# Patient Record
Sex: Female | Born: 1970 | Hispanic: No | Marital: Single | Smoking: Former smoker
Health system: Southern US, Community
[De-identification: ages and names within clinical notes are randomized; demographics above are authoritative.]

## PROBLEM LIST (undated history)

## (undated) ENCOUNTER — Ambulatory Visit (HOSPITAL_COMMUNITY)

## (undated) ENCOUNTER — Emergency Department (HOSPITAL_COMMUNITY): Admission: EM | Payer: Self-pay | Source: Home / Self Care

## (undated) DIAGNOSIS — Z8781 Personal history of (healed) traumatic fracture: Secondary | ICD-10-CM

## (undated) DIAGNOSIS — G8929 Other chronic pain: Secondary | ICD-10-CM

## (undated) DIAGNOSIS — J45909 Unspecified asthma, uncomplicated: Secondary | ICD-10-CM

## (undated) DIAGNOSIS — E611 Iron deficiency: Secondary | ICD-10-CM

## (undated) DIAGNOSIS — F419 Anxiety disorder, unspecified: Secondary | ICD-10-CM

## (undated) DIAGNOSIS — F32A Depression, unspecified: Secondary | ICD-10-CM

## (undated) DIAGNOSIS — I82409 Acute embolism and thrombosis of unspecified deep veins of unspecified lower extremity: Secondary | ICD-10-CM

## (undated) DIAGNOSIS — I10 Essential (primary) hypertension: Secondary | ICD-10-CM

## (undated) HISTORY — DX: Other chronic pain: G89.29

## (undated) HISTORY — DX: Iron deficiency: E61.1

## (undated) HISTORY — PX: FRACTURE SURGERY: SHX138

## (undated) HISTORY — DX: Personal history of (healed) traumatic fracture: Z87.81

## (undated) HISTORY — DX: Depression, unspecified: F32.A

## (undated) HISTORY — PX: ABDOMINAL HYSTERECTOMY: SHX81

## (undated) HISTORY — DX: Anxiety disorder, unspecified: F41.9

---

## 2015-05-09 DIAGNOSIS — I82409 Acute embolism and thrombosis of unspecified deep veins of unspecified lower extremity: Secondary | ICD-10-CM

## 2015-05-09 HISTORY — DX: Acute embolism and thrombosis of unspecified deep veins of unspecified lower extremity: I82.409

## 2018-05-21 ENCOUNTER — Encounter (HOSPITAL_COMMUNITY): Payer: Self-pay

## 2018-05-21 ENCOUNTER — Emergency Department (HOSPITAL_COMMUNITY)
Admission: EM | Admit: 2018-05-21 | Discharge: 2018-05-21 | Disposition: A | Discharge status: Home / Self Care | Payer: Self-pay | Attending: Emergency Medicine | Admitting: Emergency Medicine

## 2018-05-21 DIAGNOSIS — M79605 Pain in left leg: Secondary | ICD-10-CM | POA: Insufficient documentation

## 2018-05-21 DIAGNOSIS — J45909 Unspecified asthma, uncomplicated: Secondary | ICD-10-CM | POA: Insufficient documentation

## 2018-05-21 DIAGNOSIS — I10 Essential (primary) hypertension: Secondary | ICD-10-CM | POA: Insufficient documentation

## 2018-05-21 DIAGNOSIS — F17210 Nicotine dependence, cigarettes, uncomplicated: Secondary | ICD-10-CM | POA: Insufficient documentation

## 2018-05-21 HISTORY — DX: Acute embolism and thrombosis of unspecified deep veins of unspecified lower extremity: I82.409

## 2018-05-21 HISTORY — DX: Unspecified asthma, uncomplicated: J45.909

## 2018-05-21 HISTORY — DX: Essential (primary) hypertension: I10

## 2018-05-21 LAB — I-STAT CHEM 8, ED
BUN: 9 mg/dL (ref 6–20)
CREATININE: 0.7 mg/dL (ref 0.44–1.00)
Calcium, Ion: 1.2 mmol/L (ref 1.15–1.40)
Chloride: 106 mmol/L (ref 98–111)
Glucose, Bld: 80 mg/dL (ref 70–99)
HCT: 40 % (ref 36.0–46.0)
Hemoglobin: 13.6 g/dL (ref 12.0–15.0)
Potassium: 3.3 mmol/L — ABNORMAL LOW (ref 3.5–5.1)
Sodium: 141 mmol/L (ref 135–145)
TCO2: 25 mmol/L (ref 22–32)

## 2018-05-21 MED ORDER — KETOROLAC TROMETHAMINE 30 MG/ML IJ SOLN: 30.0000 mg | Freq: Once | INTRAMUSCULAR | Status: DC

## 2018-05-21 MED ORDER — RIVAROXABAN (XARELTO) VTE STARTER PACK (15 & 20 MG): ORAL_TABLET | ORAL | 0 refills | Status: DC

## 2018-05-21 MED ORDER — RIVAROXABAN 15 MG PO TABS
15.0000 mg | ORAL_TABLET | Freq: Once | ORAL | Status: AC
  Administered 2018-05-21: 15 mg via ORAL
  Filled 2018-05-21: qty 1

## 2018-05-21 NOTE — Discharge Instructions (Addendum)
I have written a new prescription for your Xarelto medication please take this as discussed.  Please go to the color department for your ultrasound to rule out any clots in your legs.  Experience any shortness of breath, chest pain, worsening symptoms please return to the ED.

## 2018-05-21 NOTE — ED Triage Notes (Addendum)
Patient arrived POV  Patient c/o left leg swelling and pain in back of the left calf Patient states, "muscles are stiffening up"  Patient came on a greyhound christmas day from [place] and after she arrived she noticed the swelling in the left calf on Christmas Day.   -Intermittent swelling since christmas  Patient is on a blood thinner   Hx. DVT 2011, 2017  A/Ox4 Ambulatory in triage

## 2018-05-21 NOTE — ED Provider Notes (Addendum)
[facility] DEPT Provider Note   CSN: [id number] Arrival date & time: 05/21/18  1806     History   Chief Complaint Chief Complaint  Patient presents with  . Leg Pain    HPI Lynn Pennington is a 48 y.o. female.  48 y.o female with a PMH of HTN, DVT presents to the ED with a complaint of left lower leg swelling since 12/15.  Patient has a previous history of DVT and is currently on a blood thinner but reports she might have missed some doses along the way.  Reports she took a Greyhound bus from [name] to [place] over Christmas and has had worsening swelling to her left lower leg.  Ports there is pain along her calf region extending into her knee and leg.  Patient currently worse compression socks and stands on her feet multiple hours a day but reports the swelling has been uncontrolled with compression socks at this time.  Ports the pain is worse with standing and ambulating.  Denies any shortness of breath, chest pain, fevers or other complaints.     Past Medical History:  Diagnosis Date  . Asthma   . DVT (deep venous thrombosis) ([place]) 2017  . Hypertension     There are no active problems to display for this patient.   Past Surgical History:  Procedure Laterality Date  . ABDOMINAL HYSTERECTOMY    . FRACTURE SURGERY       OB History   No obstetric history on file.      Home Medications    Prior to Admission medications   Medication Sig Start Date End Date Taking? Authorizing Provider  Rivaroxaban 15 & 20 MG TBPK Take as directed on package: Start with one 15mg  tablet by mouth twice a day with food. On Day 22, switch to one 20mg  tablet once a day with food. 05/21/18   Janeece Fitting, PA-C    Family History History reviewed. No pertinent family history.  Social History Social History   Tobacco Use  . Smoking status: Current Every Day Smoker    Packs/day: 0.50    Types: Cigarettes  . Smokeless tobacco: Never Used    Substance Use Topics  . Alcohol use: Not Currently  . Drug use: Not Currently     Allergies   Patient has no allergy information on record.   Review of Systems Review of Systems  Constitutional: Negative for fever.  Respiratory: Negative for shortness of breath.   Cardiovascular: Positive for leg swelling. Negative for chest pain.     Physical Exam Updated Vital Signs BP (!) 171/100   Pulse 62   Temp 98.7 F (37.1 C)   Resp 18   SpO2 100%   Physical Exam Vitals signs and nursing note reviewed.  Constitutional:      General: She is not in acute distress.    Appearance: She is well-developed.  HENT:     Head: Normocephalic and atraumatic.     Mouth/Throat:     Pharynx: No oropharyngeal exudate.  Eyes:     Pupils: Pupils are equal, round, and reactive to light.  Neck:     Musculoskeletal: Normal range of motion.  Cardiovascular:     Rate and Rhythm: Regular rhythm.     Heart sounds: Normal heart sounds.  Pulmonary:     Effort: Pulmonary effort is normal. No respiratory distress.     Breath sounds: Normal breath sounds.  Abdominal:     General: Bowel sounds are normal.  There is no distension.     Palpations: Abdomen is soft.     Tenderness: There is no abdominal tenderness.  Musculoskeletal:        General: No deformity.     Right lower leg: No edema.     Left lower leg: She exhibits tenderness and swelling. No edema.       Legs:     Comments: Swelling to the left lower leg, calf tenderness with palpation.  Strength is 5/5 dorsiflexion and plantarflexion.  Skin:    General: Skin is warm and dry.  Neurological:     Mental Status: She is alert and oriented to person, place, and time.      ED Treatments / Results  Labs (all labs ordered are listed, but only abnormal results are displayed) Labs Reviewed  I-STAT CHEM 8, ED - Abnormal; Notable for the following components:      Result Value   Potassium 3.3 (*)    All other components within normal  limits    EKG None  Radiology No results found.  Procedures Procedures (including critical care time)  Medications Ordered in ED Medications  Rivaroxaban (XARELTO) tablet 15 mg (has no administration in time range)     Initial Impression / Assessment and Plan / ED Course  I have reviewed the triage vital signs and the nursing notes.  Pertinent labs & imaging results that were available during my care of the patient were reviewed by me and considered in my medical decision making (see chart for details).     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  Patient took a hound bus ride from [name] to [place].  Reports the swelling in her left leg has worsened over time.  She reports missing a few doses of her Xarelto and unsure how many times she is missed but approximately 5 doses due to traveling and conflict in schedule.  During evaluation left leg is significantly more swollen.  She reports pain along the knee region as well.  Patient is currently working standing on her feet multiple hours of the day and states this worsens the pain.  Almyra Free we do not have ultrasound at this time and able to evaluate for a DVT.  At this time will restart patient on Xarelto with a 50 mg dose twice daily for the next 21 days and then switch back to 20 mg on day 22.  Patient reporting pain along her knee will provide her with a shot of steroid as her creatinine level is within normal limits.  Denies any chest pain, shortness of breath, no tachycardia or hypoxia during evaluation.  Show a creatinine level of 0.7 will start her dosing of Xarelto at this time providing her 50 mg today.  Urgently patient is requesting NSAID therapy at this time, I will not be providing this as she is currently going back on her Xarelto medication.  Cryotherapy will be referred at this time.  Patient's vitals stable during  ED visit, patient stable for discharge at this time.   Final Clinical Impressions(s) / ED Diagnoses   Final diagnoses:  Left leg pain    ED Discharge Orders         Ordered    LE VENOUS  Status:  Canceled     05/21/18 1959    LE VENOUS     05/21/18 2004    US  Venous Img Lower Unilateral Left     05/21/18 2007    Rivaroxaban 15 & 20 MG TBPK  Status:  Discontinued     05/21/18 2007    Rivaroxaban 15 & 20 MG TBPK     05/21/18 2007           Janeece Fitting, PA-C 05/21/18 2102    Janeece Fitting, PA-C 05/21/18 2107    Davonna Belling, MD 05/21/18 [phone]

## 2018-05-22 ENCOUNTER — Ambulatory Visit (HOSPITAL_COMMUNITY)
Admission: RE | Admit: 2018-05-22 | Discharge: 2018-05-22 | Disposition: A | Discharge status: Home / Self Care | Payer: Self-pay | Source: Ambulatory Visit | Attending: Physician Assistant | Admitting: Physician Assistant

## 2018-05-22 DIAGNOSIS — I82532 Chronic embolism and thrombosis of left popliteal vein: Secondary | ICD-10-CM | POA: Insufficient documentation

## 2018-05-22 DIAGNOSIS — M7989 Other specified soft tissue disorders: Secondary | ICD-10-CM | POA: Insufficient documentation

## 2019-01-04 ENCOUNTER — Emergency Department (HOSPITAL_COMMUNITY): Payer: Self-pay

## 2019-01-04 ENCOUNTER — Emergency Department (HOSPITAL_COMMUNITY)
Admission: EM | Admit: 2019-01-04 | Discharge: 2019-01-04 | Disposition: A | Discharge status: Home / Self Care | Payer: Self-pay | Attending: Emergency Medicine | Admitting: Emergency Medicine

## 2019-01-04 ENCOUNTER — Encounter (HOSPITAL_COMMUNITY): Payer: Self-pay | Admitting: Emergency Medicine

## 2019-01-04 ENCOUNTER — Other Ambulatory Visit: Payer: Self-pay

## 2019-01-04 DIAGNOSIS — L989 Disorder of the skin and subcutaneous tissue, unspecified: Secondary | ICD-10-CM | POA: Insufficient documentation

## 2019-01-04 DIAGNOSIS — Z7901 Long term (current) use of anticoagulants: Secondary | ICD-10-CM | POA: Insufficient documentation

## 2019-01-04 DIAGNOSIS — J45909 Unspecified asthma, uncomplicated: Secondary | ICD-10-CM | POA: Insufficient documentation

## 2019-01-04 DIAGNOSIS — R0789 Other chest pain: Secondary | ICD-10-CM | POA: Insufficient documentation

## 2019-01-04 DIAGNOSIS — I10 Essential (primary) hypertension: Secondary | ICD-10-CM | POA: Insufficient documentation

## 2019-01-04 DIAGNOSIS — F17210 Nicotine dependence, cigarettes, uncomplicated: Secondary | ICD-10-CM | POA: Insufficient documentation

## 2019-01-04 LAB — CBC WITH DIFFERENTIAL/PLATELET
Abs Immature Granulocytes: 0.02 10*3/uL (ref 0.00–0.07)
Basophils Absolute: 0 10*3/uL (ref 0.0–0.1)
Basophils Relative: 0 %
Eosinophils Absolute: 0.2 10*3/uL (ref 0.0–0.5)
Eosinophils Relative: 3 %
HCT: 37.3 % (ref 36.0–46.0)
Hemoglobin: 12.1 g/dL (ref 12.0–15.0)
Immature Granulocytes: 0 %
Lymphocytes Relative: 44 %
Lymphs Abs: 3 10*3/uL (ref 0.7–4.0)
MCH: 32.3 pg (ref 26.0–34.0)
MCHC: 32.4 g/dL (ref 30.0–36.0)
MCV: 99.5 fL (ref 80.0–100.0)
Monocytes Absolute: 0.4 10*3/uL (ref 0.1–1.0)
Monocytes Relative: 6 %
Neutro Abs: 3.2 10*3/uL (ref 1.7–7.7)
Neutrophils Relative %: 47 %
Platelets: 236 10*3/uL (ref 150–400)
RBC: 3.75 MIL/uL — ABNORMAL LOW (ref 3.87–5.11)
RDW: 13.3 % (ref 11.5–15.5)
WBC: 6.8 10*3/uL (ref 4.0–10.5)
nRBC: 0 % (ref 0.0–0.2)

## 2019-01-04 LAB — COMPREHENSIVE METABOLIC PANEL
ALT: 15 U/L (ref 0–44)
AST: 16 U/L (ref 15–41)
Albumin: 3.8 g/dL (ref 3.5–5.0)
Alkaline Phosphatase: 44 U/L (ref 38–126)
Anion gap: 9 (ref 5–15)
BUN: 13 mg/dL (ref 6–20)
CO2: 23 mmol/L (ref 22–32)
Calcium: 9.1 mg/dL (ref 8.9–10.3)
Chloride: 110 mmol/L (ref 98–111)
Creatinine, Ser: 0.73 mg/dL (ref 0.44–1.00)
GFR calc Af Amer: >60 mL/min (ref >60)
GFR calc non Af Amer: >60 mL/min (ref >60)
Glucose, Bld: 90 mg/dL (ref 70–99)
Potassium: 3.3 mmol/L — ABNORMAL LOW (ref 3.5–5.1)
Sodium: 142 mmol/L (ref 135–145)
Total Bilirubin: 0.5 mg/dL (ref 0.3–1.2)
Total Protein: 7.1 g/dL (ref 6.5–8.1)

## 2019-01-04 LAB — PROTIME-INR
INR: >10 (ref 0.8–1.2)
Prothrombin Time: >90 seconds — ABNORMAL HIGH (ref 11.4–15.2)

## 2019-01-04 LAB — BRAIN NATRIURETIC PEPTIDE: B Natriuretic Peptide: 58.4 pg/mL (ref 0.0–100.0)

## 2019-01-04 LAB — TROPONIN I (HIGH SENSITIVITY): Troponin I (High Sensitivity): 17 ng/L (ref <18)

## 2019-01-04 MED ORDER — ACETAMINOPHEN 500 MG PO TABS
500.0000 mg | ORAL_TABLET | Freq: Once | ORAL | Status: AC
  Administered 2019-01-04: 500 mg via ORAL
  Filled 2019-01-04: qty 1

## 2019-01-04 NOTE — Discharge Instructions (Addendum)
As discussed, your evaluation today has been largely reassuring.  But, it is important that you monitor your condition carefully, and do not hesitate to return to the ED if you develop new, or concerning changes in your condition. These use Tylenol, 500 mg, 3 times daily for pain control.  Otherwise, please follow-up with your physician for appropriate ongoing care.

## 2019-01-04 NOTE — ED Provider Notes (Signed)
[facility] DEPT Provider Note   CSN: [id number] Arrival date & time: 01/04/19  1423     History   Chief Complaint Chief Complaint  Patient presents with  . Abdominal Pain  . Foot Pain    HPI Lynn Pennington is a 48 y.o. female.     HPI Patient presents with concern of left foot pain and infracostal pain. Onset of the foot pain seems to been about 3 days ago, thoracic pain possibly longer, though it is unclear. Patient notes no trauma prior to the onset of foot pain, but a few days ago she noticed a palpable lesion on the distal dorsal midfoot. She has been working, which she notes involves spending substantial time on her feet. No other changes. She continues to walk, feel her toes, has no fever, chills, cough. The patient's thoracic pain is described as discomfort surrounding, roughly circumferentially her thorax, and the infracostal region. No new dyspnea, cough, fever, chills She did start exercising again about 1 month ago, but has recently stopped that activity. Patient has history of DVT is chronically anticoagulated. Past Medical History:  Diagnosis Date  . Asthma   . DVT (deep venous thrombosis) ([facility]) 2017  . Hypertension     There are no active problems to display for this patient.   Past Surgical History:  Procedure Laterality Date  . ABDOMINAL HYSTERECTOMY    . FRACTURE SURGERY       OB History   No obstetric history on file.      Home Medications    Prior to Admission medications   Medication Sig Start Date End Date Taking? Authorizing Provider  aspirin 325 MG tablet Take 325 mg by mouth daily as needed for moderate pain.   Yes [provider]  hydrochlorothiazide (HYDRODIURIL) 25 MG tablet Take 25 mg by mouth every morning. 11/25/18  Yes [provider]  XARELTO 20 MG TABS tablet Take 20 mg by mouth daily.  11/25/18  Yes [provider]    Family History No family history on file.   Social History Social History   Tobacco Use  . Smoking status: Current Every Day Smoker    Packs/day: 0.50    Types: Cigarettes  . Smokeless tobacco: Never Used  Substance Use Topics  . Alcohol use: Not Currently  . Drug use: Not Currently     Allergies   Patient has no known allergies.   Review of Systems Review of Systems  Constitutional:       Per HPI, otherwise negative  HENT:       Per HPI, otherwise negative  Respiratory:       Per HPI, otherwise negative  Cardiovascular:       Per HPI, otherwise negative  Gastrointestinal: Negative for vomiting.  Endocrine:       Negative aside from HPI  Genitourinary:       Neg aside from HPI   Musculoskeletal:       Per HPI, otherwise negative  Skin: Negative.   Allergic/Immunologic: Negative for immunocompromised state.  Neurological: Negative for syncope.  Hematological: Bruises/bleeds easily.     Physical Exam Updated Vital Signs BP (!) 147/90   Pulse 61   Temp 99 F (37.2 C) (Oral)   Resp 20   Ht 5\' 4"  (1.626 m)   Wt 99.3 kg   SpO2 97%   BMI 37.59 kg/m   Physical Exam Vitals signs and nursing note reviewed.  Constitutional:      General: She  is not in acute distress.    Appearance: She is well-developed.  HENT:     Head: Normocephalic and atraumatic.  Eyes:     Conjunctiva/sclera: Conjunctivae normal.  Cardiovascular:     Rate and Rhythm: Normal rate and regular rhythm.  Pulmonary:     Effort: Pulmonary effort is normal. No respiratory distress.     Breath sounds: Normal breath sounds. No stridor.  Abdominal:     General: There is no distension.  Musculoskeletal:       Feet:  Skin:    General: Skin is warm and dry.  Neurological:     Mental Status: She is alert and oriented to person, place, and time.     Cranial Nerves: No cranial nerve deficit.      ED Treatments / Results  Labs (all labs ordered are listed, but only abnormal results are displayed) Labs Reviewed  COMPREHENSIVE  METABOLIC PANEL - Abnormal; Notable for the following components:      Result Value   Potassium 3.3 (*)    All other components within normal limits  CBC WITH DIFFERENTIAL/PLATELET - Abnormal; Notable for the following components:   RBC 3.75 (*)    All other components within normal limits  PROTIME-INR - Abnormal; Notable for the following components:   Prothrombin Time >90.0 (*)    INR >10.0 (*)    All other components within normal limits  BRAIN NATRIURETIC PEPTIDE  TROPONIN I (HIGH SENSITIVITY)  TROPONIN I (HIGH SENSITIVITY)    EKG EKG with heart rate 58, nonspecific changes in the T waves, abnormal radiology Dg Chest Port 1 View  Result Date: 01/04/2019 CLINICAL DATA:  Epigastric pain for the past few weeks. EXAM: PORTABLE CHEST 1 VIEW COMPARISON:  None. FINDINGS: The heart size and mediastinal contours are within normal limits. Both lungs are clear. The visualized skeletal structures are unremarkable. IMPRESSION: No active disease. Electronically Signed   By: Titus Dubin M.D.   On: 01/04/2019 16:03   Dg Foot 2 Views Left  Result Date: 01/04/2019 CLINICAL DATA:  Knot over the dorsal foot. EXAM: LEFT FOOT - 2 VIEW COMPARISON:  None. FINDINGS: No acute fracture or dislocation. Joint spaces are preserved. Minimal marginal spurring of the first MTP joint. Large plantar Achilles enthesophytes. Focal round soft tissue prominence over the dorsal midfoot. IMPRESSION: 1. Focal round soft tissue prominence over the dorsal midfoot, nonspecific. Consider nonemergent MRI with and without contrast for further evaluation. 2.  No acute osseous abnormality. Electronically Signed   By: Titus Dubin M.D.   On: 01/04/2019 16:05    Procedures Procedures (including critical care time)  Medications Ordered in ED Medications  acetaminophen (TYLENOL) tablet 500 mg (500 mg Oral Given 01/04/19 1520)     Initial Impression / Assessment and Plan / ED Course  I have reviewed the triage vital signs  and the nursing notes.  Pertinent labs & imaging results that were available during my care of the patient were reviewed by me and considered in my medical decision making (see chart for details).        5:42 PM Patient is in no distress, notes that her pain is improved with Tylenol. Patient's labs, x-ray generally reassuring, with no evidence for pneumonia.  EKG with no evidence for ongoing ischemia, given the duration of symptoms for over 1 week, little suspicion for ACS.  On some suspicion for the patient's resumption of exercise contributing to her thoracic pain. Patient is on Xarelto, has appropriate lab abnormalities consistent with  this medication. Patient's foot lesions most likely neuroma versus tendon sheath cyst, no evidence for infection, patient will follow-up with podiatry.  Final Clinical Impressions(s) / ED Diagnoses   Final diagnoses:  Atypical chest pain  Foot lesion    ED Discharge Orders    None       Carmin Muskrat, MD 01/04/19 1744

## 2019-01-04 NOTE — ED Triage Notes (Signed)
Pt c/o abd pains for couple weeks. Reports having knots and pain on left foot. Hx DVTs.

## 2019-03-24 ENCOUNTER — Encounter (HOSPITAL_COMMUNITY): Payer: Self-pay | Admitting: Emergency Medicine

## 2019-03-24 ENCOUNTER — Emergency Department (HOSPITAL_COMMUNITY)
Admission: EM | Admit: 2019-03-24 | Discharge: 2019-03-25 | Disposition: A | Discharge status: Home / Self Care | Payer: Self-pay | Attending: Emergency Medicine | Admitting: Emergency Medicine

## 2019-03-24 ENCOUNTER — Other Ambulatory Visit: Payer: Self-pay

## 2019-03-24 DIAGNOSIS — F17210 Nicotine dependence, cigarettes, uncomplicated: Secondary | ICD-10-CM | POA: Insufficient documentation

## 2019-03-24 DIAGNOSIS — Z7901 Long term (current) use of anticoagulants: Secondary | ICD-10-CM | POA: Insufficient documentation

## 2019-03-24 DIAGNOSIS — Z79899 Other long term (current) drug therapy: Secondary | ICD-10-CM | POA: Insufficient documentation

## 2019-03-24 DIAGNOSIS — L299 Pruritus, unspecified: Secondary | ICD-10-CM | POA: Insufficient documentation

## 2019-03-24 DIAGNOSIS — J45909 Unspecified asthma, uncomplicated: Secondary | ICD-10-CM | POA: Insufficient documentation

## 2019-03-24 DIAGNOSIS — M7989 Other specified soft tissue disorders: Secondary | ICD-10-CM | POA: Insufficient documentation

## 2019-03-24 DIAGNOSIS — I10 Essential (primary) hypertension: Secondary | ICD-10-CM | POA: Insufficient documentation

## 2019-03-24 MED ORDER — CEPHALEXIN 500 MG PO CAPS
500.0000 mg | ORAL_CAPSULE | Freq: Once | ORAL | Status: AC
  Administered 2019-03-25: 500 mg via ORAL
  Filled 2019-03-24: qty 1

## 2019-03-24 MED ORDER — HYDROXYZINE HCL 25 MG PO TABS
25.0000 mg | ORAL_TABLET | Freq: Once | ORAL | Status: AC
  Administered 2019-03-25: 25 mg via ORAL
  Filled 2019-03-24: qty 1

## 2019-03-24 NOTE — ED Provider Notes (Signed)
[facility] DEPT Provider Note   CSN: [id number] Arrival date & time: 03/24/19  2103     History   Chief Complaint Chief Complaint  Patient presents with  . Rash    HPI Lynn Pennington is a 48 y.o. female with a history of a DVT x3 to the upper extremities on Xarelto who presents to the emergency department with a chief complaint of itching, redness, and swelling to the left upper arm over the last few days. She reports that she initially thought that she was bitten by an insect although did not see any insects crawling on her.    She reports that the last time that she had similar symptoms she had an ultrasound performed and actually had a DVT of the arm.  She has not missed any recent doses of her home Xarelto.  Reports that she has not previously had a DVT while taking Xarelto.  She denies fever, chills, numbness, weakness, left shoulder or elbow pain, chest pain, shortness of breath, cough, or leg swelling.  She has been applying alcohol on the area of redness and itching without improvement.  No other treatment prior to arrival.     The history is provided by the patient. No language interpreter was used.    Past Medical History:  Diagnosis Date  . Asthma   . DVT (deep venous thrombosis) ([facility]) 2017  . Hypertension     There are no active problems to display for this patient.   Past Surgical History:  Procedure Laterality Date  . ABDOMINAL HYSTERECTOMY    . FRACTURE SURGERY       OB History   No obstetric history on file.      Home Medications    Prior to Admission medications   Medication Sig Start Date End Date Taking? Authorizing Provider  aspirin 325 MG tablet Take 325 mg by mouth daily as needed for moderate pain.    [provider]  cephALEXin (KEFLEX) 500 MG capsule Take 1 capsule (500 mg total) by mouth 4 (four) times daily for 7 days. 03/25/19 04/01/19  Wyvonne Carda A, PA-C  hydrochlorothiazide (HYDRODIURIL)  25 MG tablet Take 25 mg by mouth every morning. 11/25/18   [provider]  hydrOXYzine (ATARAX/VISTARIL) 25 MG tablet Take 1 tablet (25 mg total) by mouth every 6 (six) hours. 03/25/19   Miamarie Moll A, PA-C  XARELTO 20 MG TABS tablet Take 20 mg by mouth daily.  11/25/18   [provider]    Family History History reviewed. No pertinent family history.  Social History Social History   Tobacco Use  . Smoking status: Current Every Day Smoker    Packs/day: 0.50    Types: Cigarettes  . Smokeless tobacco: Never Used  Substance Use Topics  . Alcohol use: Not Currently  . Drug use: Not Currently     Allergies   Patient has no known allergies.   Review of Systems Review of Systems  Constitutional: Negative for activity change, chills and fever.  Respiratory: Negative for shortness of breath and wheezing.   Cardiovascular: Negative for chest pain and leg swelling.  Gastrointestinal: Negative for abdominal pain, diarrhea, nausea and vomiting.  Genitourinary: Negative for dysuria.  Musculoskeletal: Positive for myalgias. Negative for arthralgias, back pain, gait problem, joint swelling, neck pain and neck stiffness.  Skin: Positive for color change and wound. Negative for rash.  Allergic/Immunologic: Negative for immunocompromised state.  Neurological: Negative for dizziness, seizures, syncope, weakness and headaches.  Psychiatric/Behavioral:  Negative for confusion.   Physical Exam Updated Vital Signs BP (!) 161/98 (BP Location: Right Arm)   Pulse 65   Temp 98.1 F (36.7 C) (Oral)   Resp 18   Ht 5' 4.5" (1.638 m)   Wt 90.7 kg   SpO2 100%   BMI 33.80 kg/m   Physical Exam Vitals signs and nursing note reviewed.  Constitutional:      General: She is not in acute distress.    Appearance: She is not ill-appearing, toxic-appearing or diaphoretic.  HENT:     Head: Normocephalic.  Eyes:     Conjunctiva/sclera: Conjunctivae normal.  Neck:      Musculoskeletal: Neck supple.  Cardiovascular:     Rate and Rhythm: Normal rate and regular rhythm.     Pulses: Normal pulses.     Heart sounds: Normal heart sounds. No murmur. No friction rub. No gallop.   Pulmonary:     Effort: Pulmonary effort is normal. No respiratory distress.     Breath sounds: No stridor. No wheezing, rhonchi or rales.  Chest:     Chest wall: No tenderness.  Abdominal:     General: There is no distension.     Palpations: Abdomen is soft.  Skin:    General: Skin is warm.     Findings: No rash.     Comments: 12x11 cm area of redness, induration, and warmth noted to the left upper arm.  No fluctuance.  No red streaking.  No palpable cords.  Neurological:     Mental Status: She is alert.  Psychiatric:        Behavior: Behavior normal.        ED Treatments / Results  Labs (all labs ordered are listed, but only abnormal results are displayed) Labs Reviewed - No data to display  EKG None  Radiology No results found.  Procedures Procedures (including critical care time)  Medications Ordered in ED Medications  hydrOXYzine (ATARAX/VISTARIL) tablet 25 mg (25 mg Oral Given 03/25/19 0008)  cephALEXin (KEFLEX) capsule 500 mg (500 mg Oral Given 03/25/19 0008)     Initial Impression / Assessment and Plan / ED Course  I have reviewed the triage vital signs and the nursing notes.  Pertinent labs & imaging results that were available during my care of the patient were reviewed by me and considered in my medical decision making (see chart for details).        48 year old female with a history of a DVT x3 to the upper extremities on Xarelto who presents with a large, red, warm area to her left upper arm that has progressed over the last couple of days.  No constitutional symptoms.  The patient is on Xarelto for multiple previous DVTs and denies missing any recent doses. On exam, she appears to have cellulitis.  I have a low suspicion for abscess or myositis  at this time.  However, she states that symptoms are presenting like her previous DVT when she thought she had a insect bite at that time. Low suspicion suspicion for myositis or abscess at this time.   Venous duplex is not available in the ER at this time.  I have a low suspicion for PE as she does not have any chest pain or shortness of breath and is not hypoxic or having tachypnea.  Will order an outpatient DVT study in the morning.  I will also cover her with Keflex for cellulitis and she has been given hydroxyzine for itching.  She has been  advised that if DVT study is negative that she should continue Keflex and has been given return precautions to the ER.  She is hemodynamically stable and in no acute distress.  Safe for discharge to home with follow-up as indicated.  Final Clinical Impressions(s) / ED Diagnoses   Final diagnoses:  Left arm swelling  Pruritus    ED Discharge Orders         Ordered    UE VENOUS DUPLEX     03/25/19 0000    cephALEXin (KEFLEX) 500 MG capsule  4 times daily     03/25/19 0001    hydrOXYzine (ATARAX/VISTARIL) 25 MG tablet  Every 6 hours     03/25/19 0001           Kemisha Bonnette A, PA-C 03/25/19 0109    Ward, Delice Bison, DO 03/25/19 [id number]

## 2019-03-24 NOTE — ED Triage Notes (Signed)
Patient is complaining of a rash on the back of her left arm. It is reddened. Patient states she thought it was a mosquito bite but it has gotten bigger. Patient states she used alcohol on it and it is not getting any better.

## 2019-03-25 ENCOUNTER — Ambulatory Visit (HOSPITAL_COMMUNITY)
Admission: RE | Admit: 2019-03-25 | Discharge: 2019-03-25 | Disposition: A | Discharge status: Home / Self Care | Payer: Self-pay | Source: Ambulatory Visit | Attending: Emergency Medicine | Admitting: Emergency Medicine

## 2019-03-25 DIAGNOSIS — Z86718 Personal history of other venous thrombosis and embolism: Secondary | ICD-10-CM | POA: Insufficient documentation

## 2019-03-25 DIAGNOSIS — M7989 Other specified soft tissue disorders: Secondary | ICD-10-CM

## 2019-03-25 DIAGNOSIS — R52 Pain, unspecified: Secondary | ICD-10-CM

## 2019-03-25 MED ORDER — CEPHALEXIN 500 MG PO CAPS: 500.0000 mg | ORAL_CAPSULE | Freq: Four times a day (QID) | ORAL | 0 refills | Status: AC

## 2019-03-25 MED ORDER — HYDROXYZINE HCL 25 MG PO TABS: 25.0000 mg | ORAL_TABLET | Freq: Four times a day (QID) | ORAL | 0 refills | Status: DC

## 2019-03-25 NOTE — Progress Notes (Signed)
Left upper extremity venous duplex complete. Please see CV Proc tab for preliminary results. Lita Mains- RDMS, RVT 12:54 PM  03/25/2019

## 2019-03-25 NOTE — Discharge Instructions (Addendum)
Thank you for allowing me to care for you today in the Emergency Department.   Please follow the instructions below to have the ultrasound performed of your left upper arm to make sure that you do not have a blood clot.  Until you have the ultrasound, please take 1 tablet of Keflex every 6 hours.  This is an antibiotic.  Your first dose was given in the ER.  For itching, you can take 1 tablet of hydroxyzine by mouth every 6 hours as needed.  This medication may make you drowsy so use caution if you have to work or drive.  If the ultrasound is performed and does not show a blood clot, continue to take the antibiotic for the entire 7 days even if your symptoms significantly resolved.  If you do not have a blood clot and you are taking the antibiotics as prescribed and the area on your upper arm gets more red, hot, or swollen, particularly after you have taken antibiotics for at least 2 days, you need to have the area reevaluated in the ER, by primary care, or an urgent care.  You can take 650 mg of Tylenol once every 6 hours if you develop pain.  Avoid ibuprofen since you are on a blood thinner.  IMPORTANT PATIENT INSTRUCTIONS:   You have been scheduled for an Outpatient Vascular Study at [facility]    If tomorrow is a Saturday or Sunday, please go to the [facility] Emergency Department registration desk at 8 AM tomorrow morning and tell them you are therefore a vascular study.  If tomorrow is a weekday (Monday - Friday), please go to the [facility] Admitting Department at 8 AM and tell them you are therefore a vascular study  Return to the emergency department if you develop red streaking up the arm, shortness of breath, chest pain, fevers, chills, or other new, concerning symptoms.

## 2019-03-25 NOTE — ED Notes (Addendum)
Pt verbalized discharge instructions and follow up care. Alert and ambulatory. No IV.  

## 2019-07-15 ENCOUNTER — Other Ambulatory Visit (HOSPITAL_COMMUNITY): Payer: Self-pay | Admitting: Physician Assistant

## 2019-10-22 ENCOUNTER — Ambulatory Visit: Payer: Medicaid Other | Admitting: Obstetrics

## 2019-11-13 ENCOUNTER — Telehealth: Payer: Self-pay | Admitting: *Deleted

## 2019-11-13 ENCOUNTER — Other Ambulatory Visit: Payer: Self-pay

## 2019-11-13 ENCOUNTER — Ambulatory Visit (INDEPENDENT_AMBULATORY_CARE_PROVIDER_SITE_OTHER): Payer: 59 | Admitting: Student

## 2019-11-13 ENCOUNTER — Encounter: Payer: Self-pay | Admitting: Student

## 2019-11-13 VITALS — BP 136/79 | HR 71 | Temp 99.4°F | Ht 65.4 in | Wt 227.9 lb

## 2019-11-13 DIAGNOSIS — Z0000 Encounter for general adult medical examination without abnormal findings: Secondary | ICD-10-CM | POA: Diagnosis not present

## 2019-11-13 DIAGNOSIS — F329 Major depressive disorder, single episode, unspecified: Secondary | ICD-10-CM | POA: Diagnosis not present

## 2019-11-13 DIAGNOSIS — Z86718 Personal history of other venous thrombosis and embolism: Secondary | ICD-10-CM

## 2019-11-13 DIAGNOSIS — E611 Iron deficiency: Secondary | ICD-10-CM

## 2019-11-13 DIAGNOSIS — F419 Anxiety disorder, unspecified: Secondary | ICD-10-CM

## 2019-11-13 DIAGNOSIS — J45909 Unspecified asthma, uncomplicated: Secondary | ICD-10-CM

## 2019-11-13 DIAGNOSIS — D509 Iron deficiency anemia, unspecified: Secondary | ICD-10-CM

## 2019-11-13 DIAGNOSIS — G8929 Other chronic pain: Secondary | ICD-10-CM

## 2019-11-13 DIAGNOSIS — F331 Major depressive disorder, recurrent, moderate: Secondary | ICD-10-CM

## 2019-11-13 DIAGNOSIS — M79605 Pain in left leg: Secondary | ICD-10-CM

## 2019-11-13 DIAGNOSIS — M79604 Pain in right leg: Secondary | ICD-10-CM

## 2019-11-13 DIAGNOSIS — I10 Essential (primary) hypertension: Secondary | ICD-10-CM

## 2019-11-13 DIAGNOSIS — J4520 Mild intermittent asthma, uncomplicated: Secondary | ICD-10-CM

## 2019-11-13 DIAGNOSIS — M79606 Pain in leg, unspecified: Secondary | ICD-10-CM | POA: Insufficient documentation

## 2019-11-13 HISTORY — DX: Major depressive disorder, single episode, unspecified: F32.9

## 2019-11-13 LAB — POCT GLYCOSYLATED HEMOGLOBIN (HGB A1C): Hemoglobin A1C: 5.3 % (ref 4.0–5.6)

## 2019-11-13 LAB — GLUCOSE, CAPILLARY: Glucose-Capillary: 84 mg/dL (ref 70–99)

## 2019-11-13 MED ORDER — RIVAROXABAN 20 MG PO TABS: 20.0000 mg | ORAL_TABLET | Freq: Every day | ORAL | 2 refills | Status: DC

## 2019-11-13 NOTE — Assessment & Plan Note (Signed)
GAD-7 score of 18 today in clinic. This correlates with severe anxiety. Patient was previously treated with hydroxyzine. Will consider a lon-term anxiolytic at next visit.   Plan: -- Discuss treatment options at next visit

## 2019-11-13 NOTE — Assessment & Plan Note (Signed)
Patient reported a history of asthma that is mild and intermittent. She has an albuterol inhaler but she uses it 2-3 times a month. Will monitor and modify treatment as necessary.

## 2019-11-13 NOTE — Assessment & Plan Note (Signed)
Patient reports having pain in her legs (left > right) since 2014. The pain has interfere with daily activities and her ability to do her job. Reported history of trauma and DVT to the left leg. 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 Discoloration on medial aspect of left ankle. Previously on an unknown pain regimen.   Plan: -- Referral to podiatrist --OTC Tylenol for pain control.  -- Will consider Norco for pain control if no improvement

## 2019-11-13 NOTE — Telephone Encounter (Signed)
charsetta dr Coy Saunas wants to see this pt back in 1 week, please schedule her thanks

## 2019-11-13 NOTE — Assessment & Plan Note (Signed)
Patient had a PHQ-9 score of 15 (moderate-severe depression) in clinic today. States her chronic leg pain causes her to be depress. Previously treated with Sertraline but is not currently on any medications.   Plan: --Will consider starting patient on a mood stabilizer at next visit --Will recommend counseling

## 2019-11-13 NOTE — Assessment & Plan Note (Signed)
Patient report she has a history of low iron.   Plan: Follow up CBC and iron

## 2019-11-13 NOTE — Assessment & Plan Note (Signed)
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 Currently on long term anticoagulant therapy.  Plan: Continue Xarelto 20 mg daily

## 2019-11-13 NOTE — Assessment & Plan Note (Addendum)
Had an elevated BP while in the ED last year but BP in clinic is 136/79. Currently on HTCZ 25 mg. Will monitor.

## 2019-11-13 NOTE — Patient Instructions (Addendum)
Thank you, Lynn Pennington for allowing [place] to provide your care today. Today we discussed your chronic foot pain and your other medical problems. We have ordered some baseline labs to help establish you at this clinic.  We want you to see a podiatrist to help address your leg pain. They have more treatment options for the type of pain you are having. We have sent them the referral but call them if you do not hear from them in a few days.   I have ordered the following labs for you:   Lab Orders     Lipid Profile     CBC with Diff     Iron     Hepatitis C antibody     HIV antibody (with reflex)     TSH     POC Hbg A1C   I will call if any are abnormal. All of your labs can be accessed through "My Chart".  I have place a referrals to a Podiatrist to assess your achilles   I have ordered the following medication/changed the following medications:  1. Discontinue Aspirin 325 mg 2. Get new prescription for Xarelto 20 mg daily  Please follow-up in in 1 week so we can focus on addressing your mood and other medical problems.   Should you have any questions or concerns please call the internal medicine clinic at [phone].    Linwood Dibbles, MD, MPH [facility]   My Chart Access: [email or web address]   If you have not already done so, please get your COVID 19 vaccine  To schedule an appointment for a COVID vaccine choice any of the following: Go to [email or web address]   Go to [email or web address]                  Call [phone]                                     Call [phone] and select Option 2  [facility] Address: [address], [place], [place] [address] Hours: 7 AM- 5PM Phone: [phone]
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
## 2019-11-14 LAB — HIV ANTIBODY (ROUTINE TESTING W REFLEX): HIV Screen 4th Generation wRfx: NONREACTIVE

## 2019-11-14 LAB — CBC WITH DIFFERENTIAL/PLATELET
Basophils Absolute: 0 10*3/uL (ref 0.0–0.2)
Basos: 0 %
EOS (ABSOLUTE): 0.2 10*3/uL (ref 0.0–0.4)
Eos: 2 %
Hematocrit: 39.3 % (ref 34.0–46.6)
Hemoglobin: 13 g/dL (ref 11.1–15.9)
Immature Grans (Abs): 0 10*3/uL (ref 0.0–0.1)
Immature Granulocytes: 0 %
Lymphocytes Absolute: 3.7 10*3/uL — ABNORMAL HIGH (ref 0.7–3.1)
Lymphs: 48 %
MCH: 32.1 pg (ref 26.6–33.0)
MCHC: 33.1 g/dL (ref 31.5–35.7)
MCV: 97 fL (ref 79–97)
Monocytes Absolute: 0.4 10*3/uL (ref 0.1–0.9)
Monocytes: 5 %
Neutrophils Absolute: 3.5 10*3/uL (ref 1.4–7.0)
Neutrophils: 45 %
Platelets: 208 10*3/uL (ref 150–450)
RBC: 4.05 x10E6/uL (ref 3.77–5.28)
RDW: 12.7 % (ref 11.7–15.4)
WBC: 7.8 10*3/uL (ref 3.4–10.8)

## 2019-11-14 LAB — TSH: TSH: 2.25 u[IU]/mL (ref 0.450–4.500)

## 2019-11-14 LAB — IRON: Iron: 42 ug/dL (ref 27–159)

## 2019-11-14 LAB — LIPID PANEL
Chol/HDL Ratio: 3.9 ratio (ref 0.0–4.4)
Cholesterol, Total: 156 mg/dL (ref 100–199)
HDL: 40 mg/dL (ref >39)
LDL Chol Calc (NIH): 99 mg/dL (ref 0–99)
Triglycerides: 90 mg/dL (ref 0–149)
VLDL Cholesterol Cal: 17 mg/dL (ref 5–40)

## 2019-11-14 LAB — HEPATITIS C ANTIBODY: Hep C Virus Ab: <0.1 s/co ratio (ref 0.0–0.9)

## 2019-11-14 NOTE — Progress Notes (Signed)
Internal Medicine Clinic Attending  I saw and evaluated the patient.  I personally confirmed the key portions of the history and exam documented by Dr. Coy Saunas and I reviewed pertinent patient test results.  The assessment, diagnosis, and plan were formulated together and I agree with the documentation in the resident's note.  Lynn Pennington has findings suggestive of chronic achilles tendonitis (thickening, tenderness, mild increase temp of L) and we are hopeful that a podiatrist can address appropriate footwear +/- orthotic, stretches, and potentially steroid injection (she has relative contraindication to long term NSAID while taking chronic anticoagulation for recurrent DVT).  She should not need opioid pain management.  An SSRI would be most appropriate anxiolytic for consideration in future evaluation of her chronic anxiety and depressive symptoms.

## 2019-11-18 ENCOUNTER — Other Ambulatory Visit: Payer: Self-pay | Admitting: Podiatrist

## 2019-11-18 ENCOUNTER — Encounter: Payer: Self-pay | Admitting: Podiatrist

## 2019-11-18 ENCOUNTER — Ambulatory Visit (INDEPENDENT_AMBULATORY_CARE_PROVIDER_SITE_OTHER): Payer: 59

## 2019-11-18 ENCOUNTER — Other Ambulatory Visit: Payer: Self-pay

## 2019-11-18 ENCOUNTER — Ambulatory Visit (INDEPENDENT_AMBULATORY_CARE_PROVIDER_SITE_OTHER): Payer: 59 | Admitting: Podiatrist

## 2019-11-18 VITALS — Temp 97.7°F

## 2019-11-18 DIAGNOSIS — M7662 Achilles tendinitis, left leg: Secondary | ICD-10-CM

## 2019-11-18 DIAGNOSIS — B351 Tinea unguium: Secondary | ICD-10-CM

## 2019-11-18 DIAGNOSIS — M722 Plantar fascial fibromatosis: Secondary | ICD-10-CM | POA: Diagnosis not present

## 2019-11-18 DIAGNOSIS — M79672 Pain in left foot: Secondary | ICD-10-CM

## 2019-11-18 DIAGNOSIS — M79671 Pain in right foot: Secondary | ICD-10-CM

## 2019-11-18 DIAGNOSIS — M7661 Achilles tendinitis, right leg: Secondary | ICD-10-CM | POA: Diagnosis not present

## 2019-11-18 MED ORDER — METHYLPREDNISOLONE 4 MG PO TBPK
ORAL_TABLET | ORAL | 0 refills | Status: DC
Start: 2019-11-18 — End: 2020-02-18

## 2019-11-18 NOTE — Patient Instructions (Addendum)

## 2019-11-18 NOTE — Progress Notes (Signed)
  Chief Complaint  Patient presents with  . Foot Pain    Bilateral; bottom and back of heel; pt stated, "Has a constant pain; hurts when gets up in the morning and when goes to bed at bedtime"; Since 2015  . Nail Problem    Bilateral; all nails; nail discoloration & thickened nails; x1+ yrs     HPI: Patient is 49 y.o. female who presents today for the concerns as listed above. She relates bilateral heel pain for several years.  She has pain all the time and nothing seems to help. The left is more painful than the right.  She also has nail discoloration and thickness on both feet for over a year.  She has history of 2 DVT's in the past as well.    Review of Systems No fevers, chills, nausea, muscle aches, no difficulty breathing, no calf pain, no chest pain or shortness of breath.   Physical Exam  GENERAL APPEARANCE: Alert, conversant. Appropriately groomed. No acute distress.   VASCULAR: Pedal pulses palpable DP and PT bilateral.  Capillary refill time is immediate to all digits,  Proximal to distal cooling it warm to warm.  Digital perfusion adequate.   NEUROLOGIC: sensation is intact epicritically and protectively to 5.07 monofilament at 5/5 sites bilateral.  Light touch is intact bilateral, vibratory sensation intact bilateral, achilles tendon reflex is intact bilateral.   MUSCULOSKELETAL: acceptable muscle strength, tone and stability bilateral.  No gross boney pedal deformities noted.  No pain, crepitus or limitation noted with foot and ankle range of motion bilateral.  Pain on palpation plantar medial and posterior medial aspect of the left greater than right feet.  Pain and inflammation along the medial foot and ankle is palpated left compared to right.   DERMATOLOGIC: skin is warm, supple, and dry.  No open lesions noted.  No rash, no pre ulcerative lesions. Digital nails bilateral second and left first are thick, with subungual debris present.  Onychomycosis vs. Trauma to the nail  favored.   Radiographic exam:  Spurring of the plantar and posterior calcaneus noted bilateral.  Normal osseous mineralization.  No fracture or dislocation or acute osseous abnormalities present.  Joint spaces are normal.    Assessment     ICD-10-CM   1. Plantar fasciitis, bilateral  M72.2   2. Pain in both feet  M79.671 DG Foot Complete Left   M79.672 CANCELED: DG Foot Complete Right  3. Achilles tendonitis, bilateral  M76.61    M76.62   4. Nail fungus  B35.1 Culture, fungus without smear     Plan  Discussed etiology, pathology, conservative vs. Surgical therapies and at this time an injection along with boot immobilization was recommended for the most painful left heel.  The patient agreed and a sterile skin prep was applied.  An injection consisting of dexamethasone and marcaine mixture was infiltrated into the area of maximal tenderness on the left heel.  The patient tolerated this well and was given instructions for aftercare.  A cam walker was dispensed for the left foot as well as a rx for a medrol dose pack.  Stretching exercises dispensed  and she was instructed to remove the boot several times a day and do range of motion exercises.  Took a sample of the left hallux nail to send to [facility] for fungul culture.    She will return in 2-3 weeks for follow up.

## 2019-11-20 ENCOUNTER — Ambulatory Visit (INDEPENDENT_AMBULATORY_CARE_PROVIDER_SITE_OTHER): Payer: 59 | Admitting: Student

## 2019-11-20 ENCOUNTER — Encounter: Payer: Self-pay | Admitting: Student

## 2019-11-20 VITALS — BP 132/88 | HR 68 | Temp 98.6°F

## 2019-11-20 DIAGNOSIS — F331 Major depressive disorder, recurrent, moderate: Secondary | ICD-10-CM

## 2019-11-20 DIAGNOSIS — F419 Anxiety disorder, unspecified: Secondary | ICD-10-CM

## 2019-11-20 DIAGNOSIS — F329 Major depressive disorder, single episode, unspecified: Secondary | ICD-10-CM

## 2019-11-20 DIAGNOSIS — I10 Essential (primary) hypertension: Secondary | ICD-10-CM

## 2019-11-20 MED ORDER — SERTRALINE HCL 50 MG PO TABS: 50.0000 mg | ORAL_TABLET | Freq: Every day | ORAL | 0 refills | Status: DC

## 2019-11-20 MED ORDER — SERTRALINE HCL 20 MG/ML PO CONC: 50.0000 mg | Freq: Every day | ORAL | 0 refills | Status: DC

## 2019-11-20 NOTE — Assessment & Plan Note (Signed)
Patient GAD score shows severe anxiety.  Patient was previously on hydroxyzine.  Currently not on any medication.  Plan: -Start sertraline 50 mg daily -Behavioral health referral sent -Follow-up in 2 months

## 2019-11-20 NOTE — Patient Instructions (Addendum)
Thank you, Lynn Pennington for allowing [place] to provide your care today. Today we discussed your depression and anxiety.  We plan to start she will on antidepressant that should help stabilize her mood.  This medicine takes a few weeks to take effect so we will see you in 2 months time to reassess your mood.  We have to referred you to our therapist.  Continue to follow-up with the podiatrist.  I have ordered the following labs for you:   Lab Orders     BMP8+Anion Gap   I will call if any are abnormal. All of your labs can be accessed through "My Chart".  I have place a referrals to our in house therapist for you to talk to.    I have ordered the following medication/changed the following medications:  1. Start sertraline (Zoloft) 50 mg daily  Please follow-up in 2 months.  Should you have any questions or concerns please call the internal medicine clinic at [phone].    Linwood Dibbles, MD, MPH [facility]   My Chart Access: [email or web address]   If you have not already done so, please get your COVID 19 vaccine  To schedule an appointment for a COVID vaccine choice any of the following: Go to [email or web address]   Go to [email or web address]                  Call [phone]                                     Call [phone] and select Option 2

## 2019-11-20 NOTE — Assessment & Plan Note (Signed)
Patient here with untreated depression.  PHQ-9 15 shows mild to moderate depression.  States she was previously on sertraline 50 mg.  Plan: -Start sertraline 50 mg daily -Referred to in-house therapist. -Follow-up in 2 months to assess efficacy of regimen.

## 2019-11-20 NOTE — Assessment & Plan Note (Signed)
BP improved today in clinic.  Continue HCTZ 25 mg.  Getting BMP to assess kidney function.

## 2019-11-20 NOTE — Progress Notes (Signed)
   CC: Follow up/depression  HPI:  Lynn Pennington is a 49 y.o. with PMH of anxiety, depression, chronic leg pain, DVT and HTN who presented for follow-up clinic visit.  Patient states she is now getting used to the boot on her leg and almost caused her to fall while at work.  She has been frustrated at work due to her employer not following the doctors recommendation for her to do light work.  States her mood has been up and down.  Her PHQ-9 is currently at 15, equivalent to mild-moderate depression.  Of note, patient saw podiatrist 2 days ago and was diagnosed with Achilles tendinitis.  She received a steroid injection in her left heel and given a cam walker.  Patient was prescribed a steroid taper.  Past Medical History:  Diagnosis Date  . Anxiety   . Asthma   . Chronic leg pain   . Depression   . DVT (deep venous thrombosis) ([place]) 2010, 2016  . Hypertension   . Iron deficiency    Review of Systems:  All ROS negative otherwise as stated in the HPI  Physical Exam:  General: Frustrated middle-aged woman.  No acute distress.  Well nourished, well developed. Head: Normocephalic, atraumatic w/o tenderness Eyes: PERRLA. Sclera is non-icteric Throat: MMM. No tonsillar swelling or exudate Neck: Supple without adenopathy. Thyroid gland midline without masees Cardiac: RRR. No murmurs, rubs or gallops. S1, S2. No lower extremities edema Respiratory: Lungs CTAB. No wheezing or crackles. No increased WOB Abdominal: Soft, symmetric and non tender. No organomegaly. Normal bowel sounds Skin: Warm, dry and intact without rashes or lesions Extremities: Atraumatic. Left foot secured in a cam walker. Neuro: A&O x 3. Moves all extremities Psych: Appropriate mood and affect  Vitals:   11/20/19 1455  BP: 132/88  Pulse: 68  Temp: 98.6 F (37 C)  SpO2: 100%    Assessment & Plan:   See Encounters Tab for problem based charting.  Patient seen with Dr. Michelle Nasuti, MD, MPH

## 2019-11-21 LAB — BMP8+ANION GAP
Anion Gap: 12 mmol/L (ref 10.0–18.0)
BUN/Creatinine Ratio: 18 (ref 9–23)
BUN: 13 mg/dL (ref 6–24)
CO2: 25 mmol/L (ref 20–29)
Calcium: 9.3 mg/dL (ref 8.7–10.2)
Chloride: 108 mmol/L — ABNORMAL HIGH (ref 96–106)
Creatinine, Ser: 0.73 mg/dL (ref 0.57–1.00)
GFR calc Af Amer: 110 mL/min/{1.73_m2} (ref >59)
GFR calc non Af Amer: 96 mL/min/{1.73_m2} (ref >59)
Glucose: 92 mg/dL (ref 65–99)
Potassium: 4 mmol/L (ref 3.5–5.2)
Sodium: 145 mmol/L — ABNORMAL HIGH (ref 134–144)

## 2019-11-26 ENCOUNTER — Telehealth: Payer: Self-pay | Admitting: Licensed Clinical Social Worker

## 2019-11-26 NOTE — Progress Notes (Signed)
Internal Medicine Clinic Attending  I saw and evaluated the patient.  I personally confirmed the key portions of the history and exam documented by Dr. Amponsah and I reviewed pertinent patient test results.  The assessment, diagnosis, and plan were formulated together and I agree with the documentation in the resident's note.  

## 2019-11-26 NOTE — Telephone Encounter (Signed)
Patient was called to discuss a referral from her doctor. Patient agreed, and she will be added to my schedule for 8/11 @ 10:00 for an in person visit.

## 2019-12-01 ENCOUNTER — Telehealth: Payer: Self-pay | Admitting: Podiatry

## 2019-12-01 NOTE — Telephone Encounter (Signed)
PT stated her boot does not fit properly and thinks something is wrong with it. She stated is hurting her bones. Keely advised me to tell her to come into the [facility] and ask for you to help her get the boot right.

## 2019-12-01 NOTE — Telephone Encounter (Signed)
Pt presented to office for fitting of Cam Boot. Pt had a short medium cam boot on the left leg, and stated it was pressing on the big part of her calf. I suggested to pt that we fit her for a tall medium cam boot that would give more equal pressure and support the ankle joint and leg and foot to decrease movement and allow the area to rest without as many pressure points. Pt was fitted with the tall cam boot and stated it felt much better. I showed pt she could put a pad for comfort behind the pump bubble for comfort and instructed her to release the air pressure in the boot each time she removed the boot and the refit and re-pump up each time she put it back on. Pt states understanding.

## 2019-12-12 ENCOUNTER — Ambulatory Visit (INDEPENDENT_AMBULATORY_CARE_PROVIDER_SITE_OTHER): Payer: 59 | Admitting: Podiatry

## 2019-12-12 ENCOUNTER — Encounter: Payer: Self-pay | Admitting: Podiatrist

## 2019-12-12 ENCOUNTER — Other Ambulatory Visit: Payer: Self-pay

## 2019-12-12 DIAGNOSIS — M7661 Achilles tendinitis, right leg: Secondary | ICD-10-CM | POA: Diagnosis not present

## 2019-12-12 DIAGNOSIS — M7662 Achilles tendinitis, left leg: Secondary | ICD-10-CM | POA: Diagnosis not present

## 2019-12-12 NOTE — Patient Instructions (Signed)
Look for Voltaren (diclofenac) gel and apply 4x daily to the back of the heel

## 2019-12-13 NOTE — Progress Notes (Signed)
  Subjective:  Patient ID: Yarisbel Miranda, female    DOB: 1970/10/20,  MRN: [id number]  Chief Complaint  Patient presents with  . Foot Pain    bilateral heel pain. has been wearing boot. L foot is better with the boot but the R is more sore. 1 day per week I take the boot off and stretch/rest both feet. Pain varies, sometimes up to 10/10"    49 y.o. female presents with the above complaint. History confirmed with patient.  She has been doing stretching exercises at home  Objective:  Physical Exam: warm, good capillary refill, no trophic changes or ulcerative lesions, normal DP and PT pulses and normal sensory exam. Left Foot: tenderness at Achilles tendon insertion  Right Foot: tenderness at Achilles tendon insertion    Assessment:   1. Achilles tendonitis, bilateral      Plan:  Patient was evaluated and treated and all questions answered.  Achilles Tendonitis -XR reviewed with patient -Educated on stretching and icing of the affected limb. -Referral placed to physical therapy.  -Recommended to use Voltaren 4 times daily  Return in about 6 weeks (around 01/23/2020).

## 2019-12-17 ENCOUNTER — Telehealth: Payer: Self-pay | Admitting: Licensed Clinical Social Worker

## 2019-12-17 ENCOUNTER — Ambulatory Visit: Payer: 59 | Admitting: Licensed Clinical Social Worker

## 2019-12-17 NOTE — Telephone Encounter (Signed)
Patient was called for her scheduled visit. Patient only wants to be seen in person, and was rescheduled to 8/31 @ 11:30 for an in person visit.

## 2019-12-19 ENCOUNTER — Telehealth: Payer: Self-pay | Admitting: *Deleted

## 2019-12-19 DIAGNOSIS — M7661 Achilles tendinitis, right leg: Secondary | ICD-10-CM

## 2019-12-19 NOTE — Telephone Encounter (Signed)
I informed pt of Dr. Shaune Pollack review of results. Pt states she is seen for therapy, and her feet hurt. I told pt I would inform Dr. Sherryle Lis.

## 2019-12-19 NOTE — Telephone Encounter (Signed)
  [name], DPM  Andres Ege, RN Lynn Pennington has a negative fungal culture- persistent micro trauma is the culprit. Nuvail an option or no treatment indicated   Thanks!! Dr Johnette Abraham

## 2019-12-22 NOTE — Telephone Encounter (Signed)
It looks like we sent one that day to Juliette at the [facility] office. Can you check with their office to see if they received it? If not we can re-send it. Thanks!

## 2019-12-22 NOTE — Telephone Encounter (Signed)
Ok, that would be fine with me. Should be able to use the ambulatory referral order already placed correct? Thanks for checking!

## 2019-12-22 NOTE — Telephone Encounter (Signed)
I'm calling because I was waiting for a referral for therapy for both ankles for tendinitis. I have not heard anything from anyone. Thank you.

## 2019-12-22 NOTE — Telephone Encounter (Signed)
Just called [facility]. They got the referral on Thursday but have not called because they don't have any openings but was going to call the pt today. Stated pt's insurance, [employer] is out of network with them. Suggested we refer pt to [facility].

## 2019-12-23 ENCOUNTER — Telehealth: Payer: Self-pay | Admitting: *Deleted

## 2019-12-23 ENCOUNTER — Telehealth: Payer: Self-pay | Admitting: Podiatry

## 2019-12-23 NOTE — Addendum Note (Signed)
Addended by: Celene Skeen A on: 12/23/2019 12:43 PM   Modules accepted: Orders

## 2019-12-23 NOTE — Telephone Encounter (Signed)
Sent in a referral to:  [facility] Bilateral Achilles Tendonitis Sessions=1-2 a week for 4-6 weeks  Sent order in electronically on 12/23/19  Originally sent to [facility], but patient's insurance would not cover to go there.

## 2020-01-06 ENCOUNTER — Ambulatory Visit (INDEPENDENT_AMBULATORY_CARE_PROVIDER_SITE_OTHER): Payer: 59 | Admitting: Licensed Clinical Social Worker

## 2020-01-06 ENCOUNTER — Other Ambulatory Visit: Payer: Self-pay

## 2020-01-06 DIAGNOSIS — Z0000 Encounter for general adult medical examination without abnormal findings: Secondary | ICD-10-CM

## 2020-01-06 DIAGNOSIS — F331 Major depressive disorder, recurrent, moderate: Secondary | ICD-10-CM

## 2020-01-06 NOTE — BH Specialist Note (Signed)
Integrated Behavioral Health Initial Visit  MRN: [id number] Name: Lynn Pennington  Number of [facility] Clinician visits:: 1/6 Session Start time: 11:30  Session End time: 12:30 Total time: 60 minutes  Type of Service: [place] Interpretor:No.        SUBJECTIVE: Lynn Pennington is a 49 y.o. female  'whom attended the session individually. Patient was referred by Dr. Coy Saunas for anxiety and depression. Patient reports the following symptoms/concerns: anxiety, depression, chronic pain, lack of stability, and challenges adjusting from her relocation.  Duration of problem:over one year; Severity of problem: moderate  OBJECTIVE: Mood: Anxious and Affect: Constricted Risk of harm to self or others: No plan to harm self or others  LIFE CONTEXT: Family and Social: Patient traveled to [place] over a year ago to bring her grandson. Patient has not been able to transition back to [name] where she lived prior to [facility]. Patient does have an adult child and her grandson here in [place].  School/Work: Patient reported since coming to [place] one year ago, she has worked around 8 to 9 places of employment. Patient reported she is unable to stay with the companies long due to physical health restraints.  Self-Care: Healthy coping skills need improvement. Life Changes: Moved to [place] longer than intended, and she wants to return to [name].  GOALS ADDRESSED: Patient will: 1. Reduce symptoms of: agitation, anxiety, depression and stress 2. Increase knowledge and/or ability of: coping skills, healthy habits and stress reduction  3. Demonstrate ability to: Increase healthy adjustment to current life circumstances, Increase adequate support systems for patient/family and Increase motivation to adhere to plan of care  INTERVENTIONS: Interventions utilized: Brief CBT, Supportive Counseling and Link to [employer]  Standardized Assessments completed: assessed for  SI, HI, and self-harm.  ASSESSMENT: Patient currently experiencing moderate levels of anxiety and depression. Patient processed having a hx of mental health symptoms prior to moving to [place], but they escalated once moving. Patient reported the following symptoms as problematic: chronic pain, difficulty maintaining employment, mood instability, fatigue, headaches, and crying spells.    Patient may benefit from counseling and continuing on medications.Marland Kitchen  PLAN: 1. Behavioral recommendations: Use the list of providers given to you to establish care with a local therapist.  2. Referral(s): [facility] (LME/Outside Clinic) due to my departure from [facility]. Referral placed to [employer], [place].   Dessie Coma, [facility], [place]

## 2020-01-08 ENCOUNTER — Other Ambulatory Visit: Payer: Self-pay

## 2020-01-08 ENCOUNTER — Ambulatory Visit: Payer: 59 | Attending: Podiatry | Admitting: Physical Therapy

## 2020-01-08 DIAGNOSIS — M25671 Stiffness of right ankle, not elsewhere classified: Secondary | ICD-10-CM | POA: Diagnosis present

## 2020-01-08 DIAGNOSIS — R2689 Other abnormalities of gait and mobility: Secondary | ICD-10-CM | POA: Diagnosis present

## 2020-01-08 DIAGNOSIS — M62831 Muscle spasm of calf: Secondary | ICD-10-CM | POA: Insufficient documentation

## 2020-01-08 DIAGNOSIS — M25571 Pain in right ankle and joints of right foot: Secondary | ICD-10-CM | POA: Diagnosis present

## 2020-01-08 DIAGNOSIS — M25572 Pain in left ankle and joints of left foot: Secondary | ICD-10-CM | POA: Diagnosis present

## 2020-01-08 DIAGNOSIS — M25672 Stiffness of left ankle, not elsewhere classified: Secondary | ICD-10-CM | POA: Insufficient documentation

## 2020-01-08 NOTE — Therapy (Signed)
[place], [place], [address] Phone: [phone]   Fax:  [phone]  Physical Therapy Evaluation  Patient Details  Name: Lynn Pennington MRN: [id number] Date of Birth: 12-03-1970 Referring Provider (PT): Criselda Peaches, [place]   Encounter Date: 01/08/2020   PT End of Session - 01/08/20 0934    Visit Number 1    Number of Visits 12    Date for PT Re-Evaluation 02/19/20    PT Start Time 0920    PT Stop Time 1010    PT Time Calculation (min) 50 min    Activity Tolerance Patient tolerated treatment well    Behavior During Therapy [facility] for tasks assessed/performed           Past Medical History:  Diagnosis Date  . Anxiety   . Asthma   . Chronic leg pain   . Depression   . DVT (deep venous thrombosis) ([place]) 2010, 2016  . Hypertension   . Iron deficiency     Past Surgical History:  Procedure Laterality Date  . ABDOMINAL HYSTERECTOMY    . FRACTURE SURGERY      There were no vitals filed for this visit.    Subjective Assessment - 01/08/20 0923    Subjective Pt reports increased Achilles tendon pain when walking. Pt states her feet feel heavy and that it's ripping. Pt states this has been going on since 2014. Pt reports she first noticed it while working and she stood all the time. Reports her feet always feel sore.    Pertinent History Left hip IM nail (rod taken out in 1997), DVTs (UE and LE)    Limitations Standing;Walking;Lifting;House hold activities    How long can you stand comfortably? Within a minute    How long can you walk comfortably? Worse when walking    Diagnostic tests XR:    Patient Stated Goals Decrease pain; walk for hours    Currently in Pain? Yes    Pain Score 10-Worst pain ever    Pain Location Foot    Pain Orientation Right;Left    Pain Descriptors / Indicators Other (Comment);Sore   Tearing   Pain Type Chronic pain    Pain Onset More than a month ago    Pain Frequency  Constant    Aggravating Factors  Walking    Pain Relieving Factors Boot              [facility] PT Assessment - 01/08/20 0001      Assessment   Medical Diagnosis M76.61,M76.62 (ICD-10-CM) - Achilles tendonitis, bilateral    Referring Provider (PT) McDonald, Stephan Minister, DPM    Prior Therapy None      Precautions   Precautions None      Restrictions   Weight Bearing Restrictions No      Balance Screen   Has the patient fallen in the past 6 months No      [place] residence      Prior Function   Level of Independence Independent      Observation/Other Assessments   Focus on Therapeutic Outcomes (FOTO)  To be assessed      AROM   Right Ankle Dorsiflexion -30    Right Ankle Plantar Flexion 45    Right Ankle Inversion 32    Right Ankle Eversion 25    Left Ankle Dorsiflexion -25    Left Ankle Plantar Flexion 45    Left Ankle Inversion 35  Left Ankle Eversion 10      PROM   Right Ankle Dorsiflexion -25    Right Ankle Plantar Flexion 45    Left Ankle Dorsiflexion -18    Left Ankle Plantar Flexion 50      Strength   Right Hip Flexion 5/5    Right Hip Extension 4+/5    Right Hip ABduction 4+/5    Left Hip Flexion 5/5    Left Hip Extension 4+/5    Left Hip ABduction 4+/5    Right Knee Flexion 5/5    Right Knee Extension 5/5    Left Knee Flexion 5/5    Left Knee Extension 5/5    Right Ankle Dorsiflexion 4+/5    Right Ankle Plantar Flexion 3+/5   Limited due to pain   Right Ankle Inversion 4/5    Right Ankle Eversion 4/5    Left Ankle Dorsiflexion 4+/5    Left Ankle Plantar Flexion 4/5   limited due to pain   Left Ankle Inversion 4/5    Left Ankle Eversion 4/5      Palpation   Palpation comment TTP along achilles tendon insertion; multiple knots/trigger points along bilateral gastroc/soleus; flexible/mobile foot with good arch      Ambulation/Gait   Assistive device --   Cam boot on L   Gait Pattern Decreased stance time -  right;Decreased stance time - left;Decreased dorsiflexion - right;Decreased dorsiflexion - left;Trunk flexed;Antalgic   anterior pelvic tilt                     Objective measurements completed on examination: See above findings.               PT Education - 01/08/20 1043    Education Details Discussed heel lift to decrease strain on Achilles tendon. Discussed anatomy/physiology of Achilles tendon. Discussed POC and importance of gastroc/soleus stretching, foam rolling, and strengthening    Person(s) Educated Patient    Methods Explanation;Demonstration;Tactile cues;Verbal cues;Handout    Comprehension Verbalized understanding;Returned demonstration;Verbal cues required;Tactile cues required;Need further instruction            PT Short Term Goals - 01/08/20 1017      PT SHORT TERM GOAL #1   Title Pt will be independent with initial HEP    Baseline Newly provided    Time 3    Period Weeks    Status New    Target Date 01/29/20      PT SHORT TERM GOAL #2   Title Pt will have improved ankle DF by at least 10 degrees for 50% improved tolerance to standing    Baseline lacks 18 degrees on L, lacks 25 degrees on R to neutral    Time 3    Period Weeks    Status New    Target Date 01/29/20      PT SHORT TERM GOAL #3   Title Pt will be able to perform pain free heel raise for improved toe off in gait to tolerate ambulating at least 10 min    Baseline Unable without pain    Time 3    Period Weeks    Status New    Target Date 01/29/20      PT SHORT TERM GOAL #4   Title Pt report reduction of worst pain to </=8/10    Baseline Reports pain as 10/10    Time 3    Period Weeks    Status New    Target Date 01/29/20  PT SHORT TERM GOAL #5   Title PT will obtain FOTO score in next 1 to 2 visits    Baseline Ran out of time this visit    Time 3    Period Weeks    Status New    Target Date 01/29/20             PT Long Term Goals - 01/08/20 1028        PT LONG TERM GOAL #1   Title Pt will be independent with advanced HEP    Time 6    Period Weeks    Status New    Target Date 02/19/20      PT LONG TERM GOAL #2   Title Pt will be able to improve DF to at least neutral to tolerate increased time in standing to 30 minutes with </=4/10 pain    Time 6    Period Weeks    Status New    Target Date 02/19/20      PT LONG TERM GOAL #3   Title Pt will be able to tolerate ambulating at least 20 minutes with </=4/10 pain    Time 6    Period Weeks    Status New    Target Date 02/19/20                  Plan - 01/08/20 1033    Clinical Impression Statement Ms. Shantice is a 49 y/o F presenting to OPPT with complaint of chronic ongoing heel pain. Pt states she has been diagnosed with Achilles tendonitis. Ms. Taeko is currently in a cam boot on her L LE that she states is helping to decrease the "ripping" sensation in her heel (she notes that podiatry was only able to provide her one). Pt presents with decreased gastroc/soleus muscle length (R worse than L) and decreased plantarflexor and dorsiflexor strength resulting in decreased ankle ROM, poor tolerance to standing and walking due to pain. Pt is highly motivated and will benefit from PT to address these issues and reach her goals to improve her mobility, lose weight, and reduce her pain.    Personal Factors and Comorbidities Time since onset of injury/illness/exacerbation;Comorbidity 1;Past/Current Experience    Comorbidities DVTs, prior L hip surgery    Examination-Activity Limitations Locomotion Level;Squat;Stand;Transfers    Examination-Participation Restrictions Cleaning;Community Activity;Occupation;Shop;Yard Work;Meal Prep    Stability/Clinical Decision Making Stable/Uncomplicated    Clinical Decision Making Low    Rehab Potential Good    PT Frequency 2x / week    PT Duration 6 weeks    PT Treatment/Interventions ADLs/Self Care Home Management;Aquatic Therapy;Cryotherapy;Electrical  Stimulation;Iontophoresis 4mg /ml Dexamethasone;Moist Heat;Ultrasound;Gait training;Stair training;Functional mobility training;Therapeutic activities;Therapeutic exercise;Balance training;Neuromuscular re-education;Patient/family education;Orthotic Fit/Training;Manual techniques;Passive range of motion;Taping;Vasopneumatic Device    PT Next Visit Plan Assess response to initial HEP. Continue to progress plantarflexor stretching to a weight bearing position if pt is able to tolerate. Initiate tband strengthening for ankle (particularly for DF). Manual therapy as indicated.    PT Home Exercise Plan Access Code: [email or web address]    Consulted and Agree with Plan of Care Patient           Patient will benefit from skilled therapeutic intervention in order to improve the following deficits and impairments:  Abnormal gait, Decreased range of motion, Difficulty walking, Increased fascial restricitons, Pain, Decreased activity tolerance, Decreased balance, Impaired flexibility, Decreased mobility, Decreased strength, Increased edema  Visit Diagnosis: Pain in left ankle and joints of left foot - Plan: PT plan of care cert/re-cert  Pain in right ankle and joints of right foot - Plan: PT plan of care cert/re-cert  Stiffness of left ankle, not elsewhere classified - Plan: PT plan of care cert/re-cert  Stiffness of right ankle, not elsewhere classified - Plan: PT plan of care cert/re-cert  Muscle spasm of calf - Plan: PT plan of care cert/re-cert  Other abnormalities of gait and mobility - Plan: PT plan of care cert/re-cert     Problem List Patient Active Problem List   Diagnosis Date Noted  . History of recurrent deep vein thrombosis (DVT) 11/13/2019  . Depression 11/13/2019  . Asthma 11/13/2019  . Chronic leg pain 11/13/2019  . Hypertension 11/13/2019  . Anxiety     Ellanora Rayborn April Ma L Kaelene Elliston PT, DPT 01/08/2020, 10:50 AM  [facility] [address] [place], [place], [address] Phone: [phone]   Fax:  [phone]  Name: Ruta Capece MRN: [id number] Date of Birth: February 25, 1971

## 2020-01-08 NOTE — Patient Instructions (Signed)
Access Code: [email or web address] URL: [email or web address] Date: 01/08/2020 Prepared by: Estill Bamberg April Thurnell Garbe  Exercises Seated Plantar Fascia Mobilization with Small Ball - 1 x daily - 7 x weekly - 3 sets - 10 reps Calf Mobilization with Foam Roll - 1 x daily - 7 x weekly - 10 reps Long Sitting Soleus Stretch on Bolster with Strap - 1 x daily - 7 x weekly - 3 sets - 20 sec hold Long Sitting Calf Stretch with Strap - 1 x daily - 7 x weekly - 3 sets - 20 sec hold

## 2020-01-13 ENCOUNTER — Other Ambulatory Visit: Payer: Self-pay

## 2020-01-13 ENCOUNTER — Encounter: Payer: Self-pay | Admitting: Physical Therapy

## 2020-01-13 ENCOUNTER — Ambulatory Visit: Payer: 59 | Admitting: Physical Therapy

## 2020-01-13 DIAGNOSIS — M25672 Stiffness of left ankle, not elsewhere classified: Secondary | ICD-10-CM

## 2020-01-13 DIAGNOSIS — M25671 Stiffness of right ankle, not elsewhere classified: Secondary | ICD-10-CM

## 2020-01-13 DIAGNOSIS — M25572 Pain in left ankle and joints of left foot: Secondary | ICD-10-CM

## 2020-01-13 DIAGNOSIS — M62831 Muscle spasm of calf: Secondary | ICD-10-CM

## 2020-01-13 DIAGNOSIS — R2689 Other abnormalities of gait and mobility: Secondary | ICD-10-CM

## 2020-01-13 DIAGNOSIS — M25571 Pain in right ankle and joints of right foot: Secondary | ICD-10-CM

## 2020-01-13 NOTE — Patient Instructions (Signed)
Access Code: [email or web address] URL: [email or web address] Date: 01/13/2020 Prepared by: Hessie Diener  Exercises added:   Seated Ankle Dorsiflexion with Resistance - 2 x daily - 7 x weekly - 2 sets - 10 reps Seated Ankle Plantar Flexion with Resistance Loop - 2 x daily - 7 x weekly - 2 sets - 10 reps Seated Ankle Inversion with Resistance - 2 x daily - 7 x weekly - 2 sets - 10 reps Seated Ankle Eversion with Resistance - 2 x daily - 7 x weekly - 2 sets - 10 reps

## 2020-01-13 NOTE — Therapy (Signed)
[place], [place], [address] Phone: [phone]   Fax:  [phone]  Physical Therapy Treatment  Patient Details  Name: Lynn Pennington MRN: [id number] Date of Birth: [birth year] Referring Provider (PT): Criselda Peaches, [place]   Encounter Date: 01/13/2020   PT End of Session - 01/13/20 1007    Visit Number 2    Number of Visits 12    Date for PT Re-Evaluation 02/19/20    PT Start Time 0930    PT Stop Time 1008    PT Time Calculation (min) 38 min           Past Medical History:  Diagnosis Date  . Anxiety   . Asthma   . Chronic leg pain   . Depression   . DVT (deep venous thrombosis) ([place]) 2010, 2016  . Hypertension   . Iron deficiency     Past Surgical History:  Procedure Laterality Date  . ABDOMINAL HYSTERECTOMY    . FRACTURE SURGERY      There were no vitals filed for this visit.   Subjective Assessment - 01/13/20 0938    Currently in Pain? Yes    Pain Score 7    when walking   Pain Location Heel    Pain Orientation Right;Left    Pain Descriptors / Indicators --   tearing, ripping   Pain Type Chronic pain    Aggravating Factors  walking    Pain Relieving Factors rest                             [facility] Adult PT Treatment/Exercise - 01/13/20 0001      Self-Care   Self-Care Other Self-Care Comments    Other Self-Care Comments  Using foam roller to calf in long sitting  for pressure and active DF/PF      Ankle Exercises: Stretches   Soleus Stretch 3 reps;30 seconds    Soleus Stretch Limitations Supine per HEP     Gastroc Stretch 3 reps;30 seconds    Gastroc Stretch Limitations supine per HEP      Ankle Exercises: Seated   Other Seated Ankle Exercises 4 way ankle red band bilat                   PT Education - 01/13/20 1003    Education Details HEP    Person(s) Educated Patient    Methods Explanation;Handout    Comprehension Verbalized understanding             PT Short Term Goals - 01/08/20 1017      PT SHORT TERM GOAL #1   Title Pt will be independent with initial HEP    Baseline Newly provided    Time 3    Period Weeks    Status New    Target Date 01/29/20      PT SHORT TERM GOAL #2   Title Pt will have improved ankle DF by at least 10 degrees for 50% improved tolerance to standing    Baseline lacks 18 degrees on L, lacks 25 degrees on R to neutral    Time 3    Period Weeks    Status New    Target Date 01/29/20      PT SHORT TERM GOAL #3   Title Pt will be able to perform pain free heel raise for improved toe off in gait to tolerate ambulating at least 10 min  Baseline Unable without pain    Time 3    Period Weeks    Status New    Target Date 01/29/20      PT SHORT TERM GOAL #4   Title Pt report reduction of worst pain to </=8/10    Baseline Reports pain as 10/10    Time 3    Period Weeks    Status New    Target Date 01/29/20      PT SHORT TERM GOAL #5   Title PT will obtain FOTO score in next 1 to 2 visits    Baseline Ran out of time this visit    Time 3    Period Weeks    Status New    Target Date 01/29/20             PT Long Term Goals - 01/08/20 1028      PT LONG TERM GOAL #1   Title Pt will be independent with advanced HEP    Time 6    Period Weeks    Status New    Target Date 02/19/20      PT LONG TERM GOAL #2   Title Pt will be able to improve DF to at least neutral to tolerate increased time in standing to 30 minutes with </=4/10 pain    Time 6    Period Weeks    Status New    Target Date 02/19/20      PT LONG TERM GOAL #3   Title Pt will be able to tolerate ambulating at least 20 minutes with </=4/10 pain    Time 6    Period Weeks    Status New    Target Date 02/19/20                 Plan - 01/13/20 1010    Clinical Impression Statement Pt arrives without Cam boot and states it was bruising her leg. She reports continued ripping, tearing pain with walking. Reviewed  HEP use of foam roller. She has been using theraband she had at home for calf stretch so instructed her to use a towel. Progressed with ankle theraband 4 way in sitting and updated HEP. Session toelrated well without c/o pain.    PT Next Visit Plan Capture FOTO intake-missed at initial. Assess response to initial HEP. Continue to progress plantarflexor stretching to a weight bearing position if pt is able to tolerate. Initiate tband strengthening for ankle (particularly for DF). Manual therapy as indicated.    PT Home Exercise Plan Access Code: [email or web address] seated gastroc and soleus stretch with strap, plantar fascia mobilization with ball, foam roller mob to calf: added red band 4 way ankle.           Patient will benefit from skilled therapeutic intervention in order to improve the following deficits and impairments:  Abnormal gait, Decreased range of motion, Difficulty walking, Increased fascial restricitons, Pain, Decreased activity tolerance, Decreased balance, Impaired flexibility, Decreased mobility, Decreased strength, Increased edema  Visit Diagnosis: Pain in left ankle and joints of left foot  Pain in right ankle and joints of right foot  Stiffness of left ankle, not elsewhere classified  Stiffness of right ankle, not elsewhere classified  Muscle spasm of calf  Other abnormalities of gait and mobility     Problem List Patient Active Problem List   Diagnosis Date Noted  . History of recurrent deep vein thrombosis (DVT) 11/13/2019  . Depression 11/13/2019  . Asthma 11/13/2019  . Chronic  leg pain 11/13/2019  . Hypertension 11/13/2019  . Anxiety     Dorene Ar, [place] 01/13/2020, 10:15 AM  [facility] [address] [place], [place], [address] Phone: [phone]   Fax:  [phone]  Name: Lynn Pennington MRN: [id number] Date of Birth: 1971/01/26

## 2020-01-14 ENCOUNTER — Telehealth: Payer: Self-pay | Admitting: *Deleted

## 2020-01-14 NOTE — Chronic Care Management (AMB) (Signed)
  Care Management   Note  01/14/2020 Name: Lynn Pennington MRN: [id number] DOB: 03/02/1971  Lynn Pennington is a 49 y.o. year old female who is a primary care patient of Ampons[place], Charisse March, MD. I reached out to [name] by phone today in response to a referral sent by Lynn Pennington's health plan.    Lynn Pennington was given information about care management services today including:  1. Care management services include personalized support from designated clinical staff supervised by her physician, including individualized plan of care and coordination with other care providers 2. 24/7 contact phone numbers for assistance for urgent and routine care needs. 3. The patient may stop care management services at any time by phone call to the office staff.  Patient agreed to services and verbal consent obtained.   Follow up plan: Telephone appointment with care management team member scheduled for:01/20/2020  [facility]

## 2020-01-16 ENCOUNTER — Other Ambulatory Visit: Payer: Self-pay | Admitting: Student

## 2020-01-16 ENCOUNTER — Ambulatory Visit: Payer: 59 | Admitting: Physical Therapy

## 2020-01-20 ENCOUNTER — Ambulatory Visit: Payer: 59

## 2020-01-20 ENCOUNTER — Telehealth: Payer: Self-pay

## 2020-01-20 ENCOUNTER — Ambulatory Visit: Payer: 59 | Admitting: *Deleted

## 2020-01-20 DIAGNOSIS — G8929 Other chronic pain: Secondary | ICD-10-CM

## 2020-01-20 DIAGNOSIS — J4520 Mild intermittent asthma, uncomplicated: Secondary | ICD-10-CM

## 2020-01-20 DIAGNOSIS — I10 Essential (primary) hypertension: Secondary | ICD-10-CM

## 2020-01-20 DIAGNOSIS — M79605 Pain in left leg: Secondary | ICD-10-CM

## 2020-01-20 NOTE — Progress Notes (Signed)
Internal Medicine Clinic Resident  I have personally reviewed this encounter including the documentation in this note and/or discussed this patient with the care management provider. I will address any urgent items identified by the care management provider and will communicate my actions to the patient's PCP. I have reviewed the patient's CCM visit with my supervising attending, Dr Raines.  Matt Corrado Hymon, MD 01/20/2020   

## 2020-01-20 NOTE — Chronic Care Management (AMB) (Signed)
Care Management   Initial Visit Note  01/20/2020 Name: Lynn Pennington MRN: [id number] DOB: 1970-07-15  Subjective:   Objective:  Assessment: Lynn Pennington is a 49 y.o. year old female who sees Amponsah, Charisse March, MD for primary care. The care management team was consulted for assistance with care management and care coordination needs related to Care Coordination.   Review of patient status, including review of consultants reports, relevant laboratory and other test results, and collaboration with appropriate care team members and the patient's provider was performed as part of comprehensive patient evaluation and provision of care management services.    SDOH (Social Determinants of Health) assessments performed: No See Care Plan activities for detailed interventions related to [facility])     Outpatient Encounter Medications as of 01/20/2020  Medication Sig  . beclomethasone (QVAR) 40 MCG/ACT inhaler Inhale 1 puff into the lungs daily. Pt unsure of dosage  . hydrochlorothiazide (HYDRODIURIL) 25 MG tablet Take 25 mg by mouth every morning.  . hydrOXYzine (ATARAX/VISTARIL) 25 MG tablet Take 1 tablet (25 mg total) by mouth every 6 (six) hours.  . methylPREDNISolone (MEDROL DOSEPAK) 4 MG TBPK tablet Take taper as directed in package instructions for heel pain  . rivaroxaban (XARELTO) 20 MG TABS tablet Take 1 tablet (20 mg total) by mouth daily with supper.  . sertraline (ZOLOFT) 50 MG tablet TAKE 1 TABLET(50 MG) BY MOUTH DAILY   No facility-administered encounter medications on file as of 01/20/2020.    Goals Addressed              This Visit's Progress     Patient Stated   .  Patient told CCM social worker : "I need help applying for [place] disability and with transferring my section 8 housing voucher back to [name]." (pt-stated)        CARE PLAN ENTRY (see longitudinal plan of care for additional care plan information)   Current Barriers:  . Chronic Disease Management support,  education, and care coordination needs related to HTN, Anxiety, Depression, and asthma and chronic pain - reviewed medical record for CCM RN assistance needs;  BP meeting targets of <140/<90, no documented asthma exacerbations, no recent  ED visits or hospitalizations, receiving OP rehab for chronic leg pain,  no CCM RN needs identified so will not plan to make outreach to patient at this time  Case Manager Clinical Goal(s):  Marland Kitchen Over the next 90-180 days, patient will work with BSW to address needs related to Financial constraints related to no consistent income and requesting help with applying for [place] disability and with transfer of section 8 voucher to [name]  in patient with HTN, Anxiety, Depression, and asthma and chronic pain  Interventions:  . Collaborated with BSW to initiate plan of care to address needs related to  Financial constraints related to no consistent income and requesting help with applying for [place] disability and with transfer of section 8 voucher to [name] in patient with HTN, Anxiety, Depression, and asthma and chronic pain  Patient Self Care Activities:  . Patient verbalizes understanding of plan to CCM team to address disability and section 8 housing concerns . Self administers medications as prescribed . Attends all scheduled provider appointments . Calls pharmacy for medication refills . Performs ADL's independently . Performs IADL's independently . Calls provider office for new concerns or questions  Initial goal documentation         Follow up plan:  The care management team will reach out to the patient again over  the next 30-60 days.   [name] was given information about Care Management services today including:  1. Care Management services include personalized support from designated clinical staff supervised by a physician, including individualized plan of care and coordination with other care providers 2. 24/7 contact phone numbers for assistance  for urgent and routine care needs. 3. The patient may stop Care Management services at any time (effective at the end of the month) by phone call to the office staff.  Patient agreed to services and verbal consent obtained.  Kelli Churn RN, CCM, [facility] RN Care Manager [phone]

## 2020-01-20 NOTE — Chronic Care Management (AMB) (Signed)
Chronic Care Management    Clinical Social Work General Note  01/20/2020 Name: Lynn Pennington MRN: [id number] DOB: 02-Aug-1970  Lynn Pennington is a 49 y.o. year old female who is a primary care patient of [place], Charisse March, MD. The CCM was consulted to assist the patient with [employer] .   Lynn Pennington was given information about Chronic Care Management services today including:  1. CCM service includes personalized support from designated clinical staff supervised by her physician, including individualized plan of care and coordination with other care providers 2. 24/7 contact phone numbers for assistance for urgent and routine care needs. 3. Service will only be billed when office clinical staff spend 20 minutes or more in a month to coordinate care. 4. Only one practitioner may furnish and bill the service in a calendar month. 5. The patient may stop CCM services at any time (effective at the end of the month) by phone call to the office staff. 6. The patient will be responsible for cost sharing (co-pay) of up to 20% of the service fee (after annual deductible is met).  Patient agreed to services and verbal consent obtained.   Review of patient status, including review of consultants reports, relevant laboratory and other test results, and collaboration with appropriate care team members and the patient's provider was performed as part of comprehensive patient evaluation and provision of chronic care management services.    SDOH (Social Determinants of Health) assessments and interventions performed:  Yes SDOH Interventions     Most Recent Value  SDOH Interventions  Financial Strain Interventions Other (Comment)  [will refer patient to [place] when signed consent is received]  Housing Interventions Other (Comment)  [encouraged patient to contact [name] regarding "rodent" issue]       Outpatient Encounter Medications as of 01/20/2020    Medication Sig  . beclomethasone (QVAR) 40 MCG/ACT inhaler Inhale 1 puff into the lungs daily. Pt unsure of dosage  . hydrochlorothiazide (HYDRODIURIL) 25 MG tablet Take 25 mg by mouth every morning.  . hydrOXYzine (ATARAX/VISTARIL) 25 MG tablet Take 1 tablet (25 mg total) by mouth every 6 (six) hours.  . methylPREDNISolone (MEDROL DOSEPAK) 4 MG TBPK tablet Take taper as directed in package instructions for heel pain  . rivaroxaban (XARELTO) 20 MG TABS tablet Take 1 tablet (20 mg total) by mouth daily with supper.  . sertraline (ZOLOFT) 50 MG tablet TAKE 1 TABLET(50 MG) BY MOUTH DAILY   No facility-administered encounter medications on file as of 01/20/2020.    Goals Addressed              This Visit's Progress   .  "I need help applying for [place] Disability (pt-stated)        Current Barriers:  . Patient states that she received disability income when she lived in [name].  She would like to move back there but says that "is going to be a process"  Requesting assistance with applying for [place] disability income due to being in and out of work.   Case Manager Clinical Goal(s):  Marland Kitchen Over the next 90 days, patient will work with BSW to address needs related to Financial constraints related to being in and out of work due to chronic pain . Over the next 90 days, BSW will collaborate with RN Care Manager to address care management and care coordination needs  Interventions:  . Patient interviewed and appropriate assessments performed . Informed patient about the Disability Assistance Program through [facility] . Mailed consent form required for referral. . Collaborated with RN Care Manager and patient to establish an individualized plan of care  .  Patient Self Care Activities:  . Patient verbalizes understanding of plan to complete and return consent form  Initial goal documentation     .  "I want to get a medical transfer for my section 8 voucher so I can go back to [name]"  (pt-stated)        Current Barriers:  . Patient moved to [place] approximately one year ago and states that she would prefer to move back to [name].  States that she has been in communication with caseworker at [name] about this bu they are in need of medical information.    Case Manager Clinical Goal(s):  Marland Kitchen Over the next 180 days, patient will work with BSW to address needs related to transfer of housing voucher to [name].  . Over the next 180 days, BSW will collaborate with RN Care Manager to address care management and care coordination needs  Interventions:  . Patient interviewed and appropriate assessments performed . Informed patient that consent is need for CCM BSW to collaborate with [name] . Mailed consent to patient and requested that she sign and return . Collaborated with RN Case Manager   Patient Self Care Activities:  . Performs ADL's independently . Performs IADL's independently  Initial goal documentation         Follow Up Plan: SW will follow up with patient by phone over the next [place], [place] [phone]

## 2020-01-20 NOTE — Patient Instructions (Signed)
Visit Information  Goals Addressed              This Visit's Progress     Patient Stated   .  Patient told CCM social worker : "I need help applying for [place] disability and with transferring my section 8 housing voucher back to [name]." (pt-stated)        CARE PLAN ENTRY (see longitudinal plan of care for additional care plan information)   Current Barriers:  . Chronic Disease Management support, education, and care coordination needs related to HTN, Anxiety, Depression, and asthma and chronic pain - reviewed medical record for CCM RN assistance needs;  BP meeting targets of <140/<90, no documented asthma exacerbations, no recent  ED visits or hospitalizations, receiving OP rehab for chronic leg pain,  no CCM RN needs identified so will not plan to make outreach to patient at this time  Case Manager Clinical Goal(s):  Marland Kitchen Over the next 90-180 days, patient will work with BSW to address needs related to Financial constraints related to no consistent income and requesting help with applying for [place] disability and with transfer of section 8 voucher to [name]  in patient with HTN, Anxiety, Depression, and asthma and chronic pain  Interventions:  . Collaborated with BSW to initiate plan of care to address needs related to  Financial constraints related to no consistent income and requesting help with applying for [place] disability and with transfer of section 8 voucher to [name] in patient with HTN, Anxiety, Depression, and asthma and chronic pain  Patient Self Care Activities:  . Patient verbalizes understanding of plan to CCM team to address disability and section 8 housing concerns . Self administers medications as prescribed . Attends all scheduled provider appointments . Calls pharmacy for medication refills . Performs ADL's independently . Performs IADL's independently . Calls provider office for new concerns or questions  Initial goal documentation        Ms. Diefendorf was  given information about Care Management services today including:  1. Care Management services include personalized support from designated clinical staff supervised by her physician, including individualized plan of care and coordination with other care providers 2. 24/7 contact phone numbers for assistance for urgent and routine care needs. 3. The patient may stop CCM services at any time (effective at the end of the month) by phone call to the office staff.  Patient agreed to services and verbal consent obtained.   The patient verbalized understanding of instructions provided today and declined a print copy of patient instruction materials.   The care management team will reach out to the patient again over the next 30-60 days.   Kelli Churn RN, CCM, [facility] RN Care Manager [phone]

## 2020-01-20 NOTE — Telephone Encounter (Signed)
°  Chronic Care Management   Outreach Note  01/20/2020 Name: Lynn Pennington MRN: [id number] DOB: 1971/01/30  Referred by: Lacinda Axon, MD Reason for referral : Care Coordination (disability assistance)   Unsuccessful telephone outreach to patient at scheduled appointment time. The patient was referred to the case management team for assistance with care management and care coordination.   Follow Up Plan: A HIPAA compliant phone message was left for the patient providing contact information.  Will complete initial assessment today if patient return's call and schedule allows.  Otherwise, patient will be contacted by Care Guide, Laverda Sorenson, for purpose of rescheduling assessment for another day.       Ronn Melena, [place] Coordination Social Worker [place] [phone]

## 2020-01-20 NOTE — Patient Instructions (Signed)
Licensed Clinical Social Worker Visit Information  Goals we discussed today:  Goals Addressed              This Visit's Progress   .  "I need help applying for [place] Disability (pt-stated)        Current Barriers:  . Patient states that she received disability income when she lived in [name].  She would like to move back there but says that "is going to be a process"  Requesting assistance with applying for [place] disability income due to being in and out of work.   Case Manager Clinical Goal(s):  Marland Kitchen Over the next 90 days, patient will work with BSW to address needs related to Financial constraints related to being in and out of work due to chronic pain . Over the next 90 days, BSW will collaborate with RN Care Manager to address care management and care coordination needs  Interventions:  . Patient interviewed and appropriate assessments performed . Informed patient about the Disability Assistance Program through [facility] . Mailed consent form required for referral. . Collaborated with RN Care Manager and patient to establish an individualized plan of care  .  Patient Self Care Activities:  . Patient verbalizes understanding of plan to complete and return consent form  Initial goal documentation     .  "I want to get a medical transfer for my section 8 voucher so I can go back to [name]" (pt-stated)        Current Barriers:  . Patient moved to [place] approximately one year ago and states that she would prefer to move back to [name].  States that she has been in communication with caseworker at [name] about this bu they are in need of medical information.    Case Manager Clinical Goal(s):  Marland Kitchen Over the next 180 days, patient will work with BSW to address needs related to transfer of housing voucher to [name].  . Over the next 180 days, BSW will collaborate with RN Care Manager to address care management and care coordination needs  Interventions:   . Patient interviewed and appropriate assessments performed . Informed patient that consent is need for CCM BSW to collaborate with [name] . Mailed consent to patient and requested that she sign and return . Collaborated with RN Case Manager   Patient Self Care Activities:  . Performs ADL's independently . Performs IADL's independently  Initial goal documentation         Materials provided:   Ms. Baggett was given information about Chronic Care Management services today including:  1. CCM service includes personalized support from designated clinical staff supervised by her physician, including individualized plan of care and coordination with other care providers 2. 24/7 contact phone numbers for assistance for urgent and routine care needs. 3. Service will only be billed when office clinical staff spend 20 minutes or more in a month to coordinate care. 4. Only one practitioner may furnish and bill the service in a calendar month. 5. The patient may stop CCM services at any time (effective at the end of the month) by phone call to the office staff. 6. The patient will be responsible for cost sharing (co-pay) of up to 20% of the service fee (after annual deductible is met).  Patient agreed to services and verbal consent obtained.   Patient verbalizes understanding of instructions provided today.   Follow up plan: SW will follow up with patient by phone over the next  7 days       [employer], [place] Coordination Social Worker [place] [phone]

## 2020-01-20 NOTE — Progress Notes (Signed)
Internal Medicine Clinic Resident  I have personally reviewed this encounter including the documentation in this note and/or discussed this patient with the care management provider. I will address any urgent items identified by the care management provider and will communicate my actions to the patient's PCP. I have reviewed the patient's CCM visit with my supervising attending, Dr Raines.  Matt Yasmine Kilbourne, MD 01/20/2020   

## 2020-01-21 ENCOUNTER — Ambulatory Visit: Payer: 59 | Admitting: Physical Therapy

## 2020-01-22 ENCOUNTER — Ambulatory Visit (INDEPENDENT_AMBULATORY_CARE_PROVIDER_SITE_OTHER): Payer: 59 | Admitting: Podiatry

## 2020-01-22 ENCOUNTER — Telehealth: Payer: Self-pay | Admitting: Physical Therapy

## 2020-01-22 ENCOUNTER — Other Ambulatory Visit: Payer: Self-pay

## 2020-01-22 DIAGNOSIS — M7661 Achilles tendinitis, right leg: Secondary | ICD-10-CM

## 2020-01-22 DIAGNOSIS — M79671 Pain in right foot: Secondary | ICD-10-CM

## 2020-01-22 DIAGNOSIS — M722 Plantar fascial fibromatosis: Secondary | ICD-10-CM

## 2020-01-22 DIAGNOSIS — M7662 Achilles tendinitis, left leg: Secondary | ICD-10-CM | POA: Diagnosis not present

## 2020-01-22 DIAGNOSIS — M79672 Pain in left foot: Secondary | ICD-10-CM

## 2020-01-22 NOTE — Telephone Encounter (Signed)
Spoke to patient regarding missed appointment. Reminded her of next appointment and she reports she plans to attend.

## 2020-01-22 NOTE — Progress Notes (Signed)
  Subjective:  Patient ID: Lynn Pennington, female    DOB: [birth year],  MRN: [id number]  6-week follow-up bilateral heel pain  49 y.o. female returns with the above complaint. History confirmed with patient.  She has been doing stretching exercises at home.  She has not been wearing the tall CAM boot as much because she is having bruising in her leg where it is rubbing.  She is on blood thinners from previous DVTs.  Denies numbness or tingling.  She has been going to [facility] outpatient physical therapy and had a good experience there  Objective:  Physical Exam: warm, good capillary refill, no trophic changes or ulcerative lesions, normal DP and PT pulses and normal sensory exam. Left Foot: tenderness at Achilles tendon insertion  Right Foot: tenderness at Achilles tendon insertion    Assessment:   1. Achilles tendonitis, bilateral   2. Pain in both feet   3. Plantar fasciitis, bilateral      Plan:  Patient was evaluated and treated and all questions answered.  Achilles Tendonitis -Continue stretching -Continue physical therapy.  -Continue the use of Voltaren 4 times daily -Transition her to a short her CAM boot today.  Continue WBAT in this -Consider MRI next visit if she has not improved by then  No follow-ups on file.

## 2020-01-23 ENCOUNTER — Ambulatory Visit: Payer: 59 | Admitting: Physical Therapy

## 2020-01-23 ENCOUNTER — Encounter: Payer: Self-pay | Admitting: Physical Therapy

## 2020-01-23 DIAGNOSIS — M62831 Muscle spasm of calf: Secondary | ICD-10-CM

## 2020-01-23 DIAGNOSIS — M25572 Pain in left ankle and joints of left foot: Secondary | ICD-10-CM

## 2020-01-23 DIAGNOSIS — R2689 Other abnormalities of gait and mobility: Secondary | ICD-10-CM

## 2020-01-23 DIAGNOSIS — M25671 Stiffness of right ankle, not elsewhere classified: Secondary | ICD-10-CM

## 2020-01-23 DIAGNOSIS — M25571 Pain in right ankle and joints of right foot: Secondary | ICD-10-CM

## 2020-01-23 DIAGNOSIS — M25672 Stiffness of left ankle, not elsewhere classified: Secondary | ICD-10-CM

## 2020-01-23 NOTE — Therapy (Signed)
[place], [place], [address] Phone: [phone]   Fax:  [phone]  Physical Therapy Treatment  Patient Details  Name: Lynn Pennington MRN: [id number] Date of Birth: [birth year] Referring Provider (PT): Criselda Peaches, [place]   Encounter Date: 01/23/2020   PT End of Session - 01/23/20 0938    Visit Number 3    Number of Visits 12    Date for PT Re-Evaluation 02/19/20    PT Start Time 0932    PT Stop Time 1015    PT Time Calculation (min) 43 min           Past Medical History:  Diagnosis Date  . Anxiety   . Asthma   . Chronic leg pain   . Depression   . DVT (deep venous thrombosis) ([place]) 2010, 2016  . Hypertension   . Iron deficiency     Past Surgical History:  Procedure Laterality Date  . ABDOMINAL HYSTERECTOMY    . FRACTURE SURGERY      There were no vitals filed for this visit.   Subjective Assessment - 01/23/20 0935    Subjective Still sore in the back of the heel. Saw MD yesterday and they are going to order more imaging on both ankles.    Currently in Pain? Yes    Pain Score 7     Pain Location Heel    Pain Orientation Left;Right    Pain Descriptors / Indicators --   tearing and ripping.   Pain Type Chronic pain    Aggravating Factors  walking, avtivity on feet    Pain Relieving Factors rest , staying off of it              [facility] PT Assessment - 01/23/20 0001      AROM   Right Ankle Dorsiflexion -5    Left Ankle Dorsiflexion -5                         [facility] Adult PT Treatment/Exercise - 01/23/20 0001      Manual Therapy   Manual therapy comments Passive gastroc and soleus stretches in prone 3 x 30 sec each bilateral       Ankle Exercises: Seated   Ankle Circles/Pumps 5 reps    Towel Crunch 5 reps    Heel Raises 20 reps    Other Seated Ankle Exercises 4 way ankle red band bilat  x 10 each       Ankle Exercises: Stretches   Soleus Stretch 3 reps;30  seconds    Soleus Stretch Limitations standing    Gastroc Stretch 3 reps;30 seconds    Gastroc Stretch Limitations standing                  PT Education - 01/23/20 1010    Education Details HEP    Person(s) Educated Patient    Methods Explanation;Handout    Comprehension Verbalized understanding            PT Short Term Goals - 01/23/20 0951      PT SHORT TERM GOAL #1   Title Pt will be independent with initial HEP    Time 3    Period Weeks    Status Achieved    Target Date 01/29/20      PT SHORT TERM GOAL #2   Title Pt will have improved ankle DF by at least 10 degrees for 50% improved tolerance to standing  Time 3    Period Weeks    Status Achieved      PT SHORT TERM GOAL #3   Title Pt will be able to perform pain free heel raise for improved toe off in gait to tolerate ambulating at least 10 min    Baseline still in boot    Time 3    Period Weeks    Status On-going      PT SHORT TERM GOAL #4   Title Pt report reduction of worst pain to </=8/10    Baseline avg 7/10    Time 3    Period Weeks    Status On-going      PT SHORT TERM GOAL #5   Title PT will obtain FOTO score in next 1 to 2 visits    Baseline did not capture in first 2 visits    Time 3    Period Weeks    Status Deferred    Target Date 01/29/20             PT Long Term Goals - 01/08/20 1028      PT LONG TERM GOAL #1   Title Pt will be independent with advanced HEP    Time 6    Period Weeks    Status New    Target Date 02/19/20      PT LONG TERM GOAL #2   Title Pt will be able to improve DF to at least neutral to tolerate increased time in standing to 30 minutes with </=4/10 pain    Time 6    Period Weeks    Status New    Target Date 02/19/20      PT LONG TERM GOAL #3   Title Pt will be able to tolerate ambulating at least 20 minutes with </=4/10 pain    Time 6    Period Weeks    Status New    Target Date 02/19/20                 Plan - 01/23/20 0940     Clinical Impression Statement MD to order [place] and she is to continue PT. Her DF AROM is improved bilateral. PAssively her right ankle if more flexible into DF. She continues with boot on Left foot. Rates her pain at 77/10 bilateral heels with more pain with increased activity. STG#1, #2 met.    PT Next Visit Plan Assess response to initial HEP. Continue to progress plantarflexor stretching to a weight bearing position if pt is able to tolerate. Initiate tband strengthening for ankle (particularly for DF). Manual therapy as indicated.    PT Home Exercise Plan Access Code: [email or web address] seated gastroc and soleus stretch with strap, plantar fascia mobilization with ball, foam roller mob to calf: added red band 4 way ankle.; added standing gastroc and soleus stretches    Consulted and Agree with Plan of Care Patient           Patient will benefit from skilled therapeutic intervention in order to improve the following deficits and impairments:  Abnormal gait, Decreased range of motion, Difficulty walking, Increased fascial restricitons, Pain, Decreased activity tolerance, Decreased balance, Impaired flexibility, Decreased mobility, Decreased strength, Increased edema  Visit Diagnosis: Pain in left ankle and joints of left foot  Pain in right ankle and joints of right foot  Stiffness of left ankle, not elsewhere classified  Stiffness of right ankle, not elsewhere classified  Muscle spasm of calf  Other abnormalities of gait and mobility  Problem List Patient Active Problem List   Diagnosis Date Noted  . History of recurrent deep vein thrombosis (DVT) 11/13/2019  . Depression 11/13/2019  . Asthma 11/13/2019  . Chronic leg pain 11/13/2019  . Hypertension 11/13/2019  . Anxiety     Hessie Diener [facility] 01/23/2020, 10:19 AM  [facility] [address] [place], [place], [address] Phone: [phone]   Fax:  [phone]  Name: Masa Lubin MRN: [id number] Date of Birth: 1970-06-17

## 2020-01-23 NOTE — Patient Instructions (Signed)
Access Code: [email or web address] URL: [email or web address] Date: 01/23/2020 Prepared by: Hessie Diener  Added Standing Gastroc Stretch - 2 x daily - 7 x weekly - 1 sets - 3 reps - 20 hold Standing Soleus Stretch - 2 x daily - 7 x weekly - 1 sets - 3 reps - 20 hold

## 2020-01-25 ENCOUNTER — Other Ambulatory Visit: Payer: Self-pay | Admitting: Student

## 2020-01-26 ENCOUNTER — Ambulatory Visit: Payer: 59 | Admitting: Physical Therapy

## 2020-01-26 ENCOUNTER — Encounter: Payer: Self-pay | Admitting: Physical Therapy

## 2020-01-26 ENCOUNTER — Other Ambulatory Visit: Payer: Self-pay

## 2020-01-26 ENCOUNTER — Ambulatory Visit: Payer: 59

## 2020-01-26 DIAGNOSIS — M25571 Pain in right ankle and joints of right foot: Secondary | ICD-10-CM

## 2020-01-26 DIAGNOSIS — M25572 Pain in left ankle and joints of left foot: Secondary | ICD-10-CM | POA: Diagnosis not present

## 2020-01-26 DIAGNOSIS — G8929 Other chronic pain: Secondary | ICD-10-CM

## 2020-01-26 DIAGNOSIS — M25671 Stiffness of right ankle, not elsewhere classified: Secondary | ICD-10-CM

## 2020-01-26 DIAGNOSIS — M25672 Stiffness of left ankle, not elsewhere classified: Secondary | ICD-10-CM

## 2020-01-26 DIAGNOSIS — J4520 Mild intermittent asthma, uncomplicated: Secondary | ICD-10-CM

## 2020-01-26 DIAGNOSIS — M62831 Muscle spasm of calf: Secondary | ICD-10-CM

## 2020-01-26 DIAGNOSIS — M79604 Pain in right leg: Secondary | ICD-10-CM

## 2020-01-26 DIAGNOSIS — R2689 Other abnormalities of gait and mobility: Secondary | ICD-10-CM

## 2020-01-26 DIAGNOSIS — I10 Essential (primary) hypertension: Secondary | ICD-10-CM

## 2020-01-26 NOTE — Patient Instructions (Signed)
Visit Information  Goals Addressed              This Visit's Progress     "I need help applying for [place] Disability (pt-stated)        Current Barriers:   Patient states that she received disability income when she lived in [name].  She would like to move back there but says that "is going to be a process"  Requesting assistance with applying for [place] disability income due to being in and out of work.   Case Manager Clinical Goal(s):   Over the next 90 days, patient will work with BSW to address needs related to Financial constraints related to being in and out of work due to chronic pain  Over the next 90 days, BSW will collaborate with Consulting civil engineer to address care management and care coordination needs  Interventions:   Contacted patient to confirm receipt of consent that was mailed in order for BSW to submit referral to [place]  Informed patient that, per chart/appointment review, she has overlapping appointments on 01/28/20; patient stated that she intends to reschedule orthopedic appointment  Informed patient that she can bring signed consent to [facility] appointment on 01/28/20 rather than mailing it back.     Patient Self Care Activities:   Patient verbalizes understanding of plan to complete and return consent form  Please see past updates related to this goal by clicking on the "Past Updates" button in the selected goal        "I want to get a medical transfer for my section 8 voucher so I can go back to [name]" (pt-stated)        Current Barriers:   Patient moved to [place] approximately one year ago and states that she would prefer to move back to [name].  States that she has been in communication with caseworker at [name] about this bu they are in need of medical information.    Case Manager Clinical Goal(s):   Over the next 180 days, patient will work with BSW to address needs related to transfer of housing voucher to  [name].   Over the next 180 days, BSW will collaborate with RN Care Manager to address care management and care coordination needs  Interventions:   Contacted patient to confirm receipt of consent that was mailed in order for BSW to collaborate with [name].   Informed patient that, per chart/appointment review, she has overlapping appointments on 01/28/20; patient stated that she intends to reschedule orthopedic appointment  Informed patient that she can bring signed consent to [facility] appointment on 01/28/20 rather than mailing it back.    Patient Self Care Activities:   Performs ADL's independently  Performs IADL's independently  Please see past updates related to this goal by clicking on the "Past Updates" button in the selected goal         Patient verbalizes understanding of instructions provided today.    Will submit referral to [place] when signed consent is received.  Will send consent to [name] when received.        Ronn Melena, [place] Coordination Social Worker [place] [phone]

## 2020-01-26 NOTE — Chronic Care Management (AMB) (Signed)
Care Management   Follow Up Note   01/26/2020 Name: Lynn Pennington MRN: [id number] DOB: 1970-10-18  Referred by: Lacinda Axon, MD Reason for referral : Care Coordination   Lynn Pennington is a 49 y.o. year old female who is a primary care patient of Amponsah, Charisse March, MD. The care management team was consulted for assistance with care management and care coordination needs.    Review of patient status, including review of consultants reports, relevant laboratory and other test results, and collaboration with appropriate care team members and the patient's provider was performed as part of comprehensive patient evaluation and provision of chronic care management services.    SDOH (Social Determinants of Health) assessments performed: No See Care Plan activities for detailed interventions related to [facility])     Advanced Directives: See Care Plan and Vynca application for related entries.   Goals Addressed              This Visit's Progress   .  "I need help applying for [place] Disability (pt-stated)        Current Barriers:  . Patient states that she received disability income when she lived in [name].  She would like to move back there but says that "is going to be a process"  Requesting assistance with applying for [place] disability income due to being in and out of work.   Case Manager Clinical Goal(s):  Marland Kitchen Over the next 90 days, patient will work with BSW to address needs related to Financial constraints related to being in and out of work due to chronic pain . Over the next 90 days, BSW will collaborate with RN Care Manager to address care management and care coordination needs  Interventions:  . Contacted patient to confirm receipt of consent that was mailed in order for BSW to submit referral to [place] . Informed patient that, per chart/appointment review, she has overlapping appointments on 01/28/20; patient stated that she intends to reschedule  orthopedic appointment . Informed patient that she can bring signed consent to [facility] appointment on 01/28/20 rather than mailing it back.   .  Patient Self Care Activities:  . Patient verbalizes understanding of plan to complete and return consent form  Please see past updates related to this goal by clicking on the "Past Updates" button in the selected goal      .  "I want to get a medical transfer for my section 8 voucher so I can go back to [name]" (pt-stated)        Current Barriers:  . Patient moved to [place] approximately one year ago and states that she would prefer to move back to [name].  States that she has been in communication with caseworker at [name] about this bu they are in need of medical information.    Case Manager Clinical Goal(s):  Marland Kitchen Over the next 180 days, patient will work with BSW to address needs related to transfer of housing voucher to [name].  . Over the next 180 days, BSW will collaborate with RN Care Manager to address care management and care coordination needs  Interventions:  . Contacted patient to confirm receipt of consent that was mailed in order for BSW to collaborate with [name].  . Informed patient that, per chart/appointment review, she has overlapping appointments on 01/28/20; patient stated that she intends to reschedule orthopedic appointment . Informed patient that she can bring signed consent to [facility] appointment on 01/28/20 rather than mailing it  back.    Patient Self Care Activities:  . Performs ADL's independently . Performs IADL's independently  Please see past updates related to this goal by clicking on the "Past Updates" button in the selected goal          Will submit referral to [place] when signed consent is received.  Will send consent to [name] when received.        Ronn Melena, [place] Coordination Social Worker [place] [phone]

## 2020-01-26 NOTE — Therapy (Addendum)
[place], [place], [address] Phone: [phone]   Fax:  [phone]  Physical Therapy Treatment and Discharge  Patient Details  Name: Lynn Pennington MRN: [id number] Date of Birth: [birth year] Referring Provider (PT): Criselda Peaches, [place]   Encounter Date: 01/26/2020   PT End of Session - 01/26/20 1322    Visit Number 4    Number of Visits 12    Date for PT Re-Evaluation 02/19/20    PT Start Time 1319    PT Stop Time 3007    PT Time Calculation (min) 39 min           Past Medical History:  Diagnosis Date  . Anxiety   . Asthma   . Chronic leg pain   . Depression   . DVT (deep venous thrombosis) ([place]) 2010, 2016  . Hypertension   . Iron deficiency     Past Surgical History:  Procedure Laterality Date  . ABDOMINAL HYSTERECTOMY    . FRACTURE SURGERY      There were no vitals filed for this visit.   Subjective Assessment - 01/26/20 1321    Subjective I am doing well with the stretches. 5/10 pain back of both heels today. I took ibuprofen.    Currently in Pain? Yes    Pain Score 5     Pain Location Heel    Pain Orientation Right;Left    Pain Descriptors / Indicators Tightness;Sore    Pain Type Chronic pain                             [facility] Adult PT Treatment/Exercise - 01/26/20 0001      Ambulation/Gait   Gait Comments instructed in heel strike and roll through mechanics .       Ankle Exercises: Aerobic   Recumbent Bike L1 x 5 minutes       Ankle Exercises: Standing   SLS 25 sec each without UE     Heel Raises 10 reps    Toe Raise 10 reps      Ankle Exercises: Stretches   Soleus Stretch 3 reps;20 seconds    Soleus Stretch Limitations standing    Gastroc Stretch 3 reps;20 seconds    Gastroc Stretch Limitations standing      Ankle Exercises: Seated   Ankle Circles/Pumps 5 reps    Towel Crunch 5 reps    Other Seated Ankle Exercises Df with red band supine x  20 each                     PT Short Term Goals - 01/23/20 0951      PT SHORT TERM GOAL #1   Title Pt will be independent with initial HEP    Time 3    Period Weeks    Status Achieved    Target Date 01/29/20      PT SHORT TERM GOAL #2   Title Pt will have improved ankle DF by at least 10 degrees for 50% improved tolerance to standing    Time 3    Period Weeks    Status Achieved      PT SHORT TERM GOAL #3   Title Pt will be able to perform pain free heel raise for improved toe off in gait to tolerate ambulating at least 10 min    Baseline still in boot    Time 3    Period  Weeks    Status On-going      PT SHORT TERM GOAL #4   Title Pt report reduction of worst pain to </=8/10    Baseline avg 7/10    Time 3    Period Weeks    Status On-going      PT SHORT TERM GOAL #5   Title PT will obtain FOTO score in next 1 to 2 visits    Baseline did not capture in first 2 visits    Time 3    Period Weeks    Status Deferred    Target Date 01/29/20             PT Long Term Goals - 01/08/20 1028      PT LONG TERM GOAL #1   Title Pt will be independent with advanced HEP    Time 6    Period Weeks    Status New    Target Date 02/19/20      PT LONG TERM GOAL #2   Title Pt will be able to improve DF to at least neutral to tolerate increased time in standing to 30 minutes with </=4/10 pain    Time 6    Period Weeks    Status New    Target Date 02/19/20      PT LONG TERM GOAL #3   Title Pt will be able to tolerate ambulating at least 20 minutes with </=4/10 pain    Time 6    Period Weeks    Status New    Target Date 02/19/20                 Plan - 01/26/20 1323    Clinical Impression Statement Pt arrives in sneakers. Able to begin Rec bike and closed chain ankle strength, Reviewed stretches and performed manual to decreased calf and achilles tightness. Pt reports she is seeing improvement in pain.    PT Next Visit Plan continue closed chain  stretching, assess response to initial closed chain strength, manual prn.    PT Home Exercise Plan Access Code: [email or web address] seated gastroc and soleus stretch with strap, plantar fascia mobilization with ball, foam roller mob to calf: added red band 4 way ankle.; added standing gastroc and soleus stretches           PHYSICAL THERAPY DISCHARGE SUMMARY  Visits from Start of Care: 4  Current functional level related to goals / functional outcomes: See above   Remaining deficits: See above   Education / Equipment: See above  Plan:                                                    Patient goals were partially met. Patient is being discharged due to not returning since the last visit.  ?????         Patient will benefit from skilled therapeutic intervention in order to improve the following deficits and impairments:  Abnormal gait, Decreased range of motion, Difficulty walking, Increased fascial restricitons, Pain, Decreased activity tolerance, Decreased balance, Impaired flexibility, Decreased mobility, Decreased strength, Increased edema  Visit Diagnosis: Pain in left ankle and joints of left foot  Pain in right ankle and joints of right foot  Stiffness of left ankle, not elsewhere classified  Stiffness of right ankle, not elsewhere classified  Muscle spasm of calf  Other abnormalities of gait and mobility     Problem List Patient Active Problem List   Diagnosis Date Noted  . History of recurrent deep vein thrombosis (DVT) 11/13/2019  . Depression 11/13/2019  . Asthma 11/13/2019  . Chronic leg pain 11/13/2019  . Hypertension 11/13/2019  . Anxiety     Dorene Ar, PTA 01/26/2020, 1:57 PM  [facility] April Gordy Levan, [place], DPT 02/12/2020, 3:34 PM  [facility] [address] [place], [place], [address] Phone: [phone]   Fax:  [phone]  Name: Karel Mowers MRN: [id number] Date of Birth:  Apr 23, 1971

## 2020-01-27 ENCOUNTER — Encounter: Payer: 59 | Admitting: Internal Medicine

## 2020-01-28 ENCOUNTER — Encounter: Payer: Self-pay | Admitting: Internal Medicine

## 2020-01-28 ENCOUNTER — Ambulatory Visit (INDEPENDENT_AMBULATORY_CARE_PROVIDER_SITE_OTHER): Payer: 59 | Admitting: Internal Medicine

## 2020-01-28 ENCOUNTER — Ambulatory Visit: Payer: 59 | Admitting: Physical Therapy

## 2020-01-28 ENCOUNTER — Other Ambulatory Visit: Payer: Self-pay

## 2020-01-28 DIAGNOSIS — F331 Major depressive disorder, recurrent, moderate: Secondary | ICD-10-CM | POA: Diagnosis not present

## 2020-01-28 DIAGNOSIS — F419 Anxiety disorder, unspecified: Secondary | ICD-10-CM

## 2020-01-28 NOTE — Assessment & Plan Note (Signed)
See anxiety

## 2020-01-28 NOTE — Progress Notes (Signed)
   CC: Depression  HPI:  [name] is a 49 y.o. with PMH as below.   Please see A&P for assessment of the patient's acute and chronic medical conditions.   History of depression and anxiety, for which she used to be seen in [name], although she is not sure of exact diagnosis. Was previously on zoloft in [name]. She is isolated due to covid and as she has no close family due to being raised in an orphanage and has not developed any real friendships in [place] or good support. She feels that when she is around people they think she is weird and just wants to be alone all the time. She feels confused and for the last two nights has been unable to sleep. She has episodes of anxiety but is unable to clarify how long this lasts, but three times today she felt like she could not breath. She has a history of self injury years ago but not recently. She denies current SI but states that she has suicidal ideation sometimes. No history of extreme spending or manic like episodes.    Past Medical History:  Diagnosis Date  . Anxiety   . Asthma   . Chronic leg pain   . Depression   . DVT (deep venous thrombosis) ([place]) 2010, 2016  . Hypertension   . Iron deficiency    Review of Systems:   10 point ROS negative except as noted in HPI  Physical Exam:   Constitution: tearful, distressed, appears stated age 64: RRR, no m/r/g, no LE edema  Respiratory: CTA, no w/r/r Neuro: quiet, flat affect, alert and oriented but difficult to redirect discussion, repetitive speech Skin: c/d/i    Vitals:   01/28/20 1428  BP: (!) 143/84  Pulse: 74  Temp: 98.6 F (37 C)  TempSrc: Oral  Weight: 237 lb 3.2 oz (107.6 kg)  Height: 5\' 4"  (1.626 m)    Assessment & Plan:   See Encounters Tab for problem based charting.  Patient discussed with Dr. Daryll Drown

## 2020-01-28 NOTE — Assessment & Plan Note (Signed)
History of depression and anxiety, for which she used to be seen in [name], although she is not sure of exact diagnosis. Was previously on zoloft in [name]. She is isolated due to covid and as she has no close family due to being raised in an orphanage and has not developed any real friendships in [place] or good support. She feels that when she is around people they think she is weird and just wants to be alone all the time. She feels confused and for the last two nights has been unable to sleep. She has episodes of anxiety but is unable to clarify how long this lasts, but three times today she felt like she could not breath. She has a history of self injury years ago but not recently. She denies current SI but states that she has suicidal ideation sometimes. No history of extreme spending or manic like episodes.  Affect is very quiet, difficult to obtain direct answers. She is distressed, tearful, rocking.   - Urgent call placed with our counselor who she spoke to on the phone. They will follow up with the am and help establish care with psychiatry. We will follow-up early next week to make sure she is establishing care with pysch as her symptoms may be more consistent with mood disorder as well as depression.

## 2020-01-28 NOTE — Patient Instructions (Signed)
Thank you for allowing [place] to provide your care today. Today we discussed your current depression   Please follow-up for a telehealth visit next week with [place].   Please call the internal medicine center clinic if you have any questions or concerns, we may be able to help and keep you from a long and expensive emergency room wait. Our clinic and after hours phone number is [phone], the best time to call is Monday through Friday 9 am to 4 pm but there is always someone available 24/7 if you have an emergency. If you need medication refills please notify your pharmacy one week in advance and they will send [place] a request.

## 2020-01-29 ENCOUNTER — Ambulatory Visit: Payer: 59

## 2020-01-29 DIAGNOSIS — G8929 Other chronic pain: Secondary | ICD-10-CM

## 2020-01-29 DIAGNOSIS — I10 Essential (primary) hypertension: Secondary | ICD-10-CM

## 2020-01-29 DIAGNOSIS — J4520 Mild intermittent asthma, uncomplicated: Secondary | ICD-10-CM

## 2020-01-29 NOTE — Patient Instructions (Signed)
Visit Information  Goals Addressed              This Visit's Progress   .  "I need help applying for [place] Disability (pt-stated)        Current Barriers:  . Patient states that she received disability income when she lived in [name].  She would like to move back there but says that "is going to be a process"  Requesting assistance with applying for [place] disability income due to being in and out of work.   Case Manager Clinical Goal(s):  Marland Kitchen Over the next 90 days, patient will work with BSW to address needs related to Financial constraints related to being in and out of work due to chronic pain . Over the next 90 days, BSW will collaborate with RN Care Manager to address care management and care coordination needs  Interventions:  . Marland KitchenInformed patient that she can mail signed consent to [facility] as she forgot to bring it to clinic appointment yesterday.   .    .  Patient Self Care Activities:  . Patient verbalizes understanding of plan to complete and return consent form  Please see past updates related to this goal by clicking on the "Past Updates" button in the selected goal      .  "I want to get a medical transfer for my section 8 voucher so I can go back to [name]" (pt-stated)        Current Barriers:  . Patient moved to [place] approximately one year ago and states that she would prefer to move back to [name].  States that she has been in communication with caseworker at [name] about this bu they are in need of medical information.    Case Manager Clinical Goal(s):  Marland Kitchen Over the next 180 days, patient will work with BSW to address needs related to transfer of housing voucher to [name].  . Over the next 180 days, BSW will collaborate with RN Care Manager to address care management and care coordination needs  Interventions:  . Informed patient that she can mail signed consent to [facility] as she forgot to bring it to clinic appointment yesterday.    Patient Self  Care Activities:  . Performs ADL's independently . Performs IADL's independently  Please see past updates related to this goal by clicking on the "Past Updates" button in the selected goal         Patient verbalizes understanding of instructions provided today.    Will submit referral to [place] when signed consent is received and follow up with patient at that time.    Ronn Melena, [place] Coordination Social Worker [place] [phone]

## 2020-01-29 NOTE — Chronic Care Management (AMB) (Signed)
Care Management   Follow Up Note   01/29/2020 Name: Lynn Pennington MRN: [id number] DOB: 1971-01-12  Referred by: Lacinda Axon, MD Reason for referral : Care Coordination (disability assistance)   Lynn Pennington is a 49 y.o. year old female who is a primary care patient of Amponsah, Charisse March, MD. The care management team was consulted for assistance with care management and care coordination needs.    Review of patient status, including review of consultants reports, relevant laboratory and other test results, and collaboration with appropriate care team members and the patient's provider was performed as part of comprehensive patient evaluation and provision of chronic care management services.    SDOH (Social Determinants of Health) assessments performed: No See Care Plan activities for detailed interventions related to [facility])     Advanced Directives: See Care Plan and Vynca application for related entries.   Goals Addressed              This Visit's Progress   .  "I need help applying for [place] Disability (pt-stated)        Current Barriers:  . Patient states that she received disability income when she lived in [name].  She would like to move back there but says that "is going to be a process"  Requesting assistance with applying for [place] disability income due to being in and out of work.   Case Manager Clinical Goal(s):  Marland Kitchen Over the next 90 days, patient will work with BSW to address needs related to Financial constraints related to being in and out of work due to chronic pain . Over the next 90 days, BSW will collaborate with RN Care Manager to address care management and care coordination needs  Interventions:  . Marland KitchenInformed patient that she can mail signed consent to [facility] as she forgot to bring it to clinic appointment yesterday.   .    .  Patient Self Care Activities:  . Patient verbalizes understanding of plan to complete and return consent form  Please see past  updates related to this goal by clicking on the "Past Updates" button in the selected goal      .  "I want to get a medical transfer for my section 8 voucher so I can go back to [name]" (pt-stated)        Current Barriers:  . Patient moved to [place] approximately one year ago and states that she would prefer to move back to [name].  States that she has been in communication with caseworker at [name] about this bu they are in need of medical information.    Case Manager Clinical Goal(s):  Marland Kitchen Over the next 180 days, patient will work with BSW to address needs related to transfer of housing voucher to [name].  . Over the next 180 days, BSW will collaborate with RN Care Manager to address care management and care coordination needs  Interventions:  . Informed patient that she can mail signed consent to [facility] as she forgot to bring it to clinic appointment yesterday.    Patient Self Care Activities:  . Performs ADL's independently . Performs IADL's independently  Please see past updates related to this goal by clicking on the "Past Updates" button in the selected goal          Will submit referral to [place] when signed consent is received and follow up with patient at that time.    Geographical information systems officer, [place]  [place] [phone]

## 2020-02-02 ENCOUNTER — Telehealth: Payer: Self-pay | Admitting: Physical Therapy

## 2020-02-02 ENCOUNTER — Ambulatory Visit: Payer: 59 | Admitting: Physical Therapy

## 2020-02-02 NOTE — Telephone Encounter (Signed)
Left voicemail regarding no show to appointment today. Left next appointment time and asked her to call if she needed to cancel or reschedule.

## 2020-02-03 NOTE — Progress Notes (Signed)
Internal Medicine Clinic Attending  Case discussed with Dr. Seawell  At the time of the visit.  We reviewed the resident's history and exam and pertinent patient test results.  I agree with the assessment, diagnosis, and plan of care documented in the resident's note.  

## 2020-02-04 ENCOUNTER — Ambulatory Visit (INDEPENDENT_AMBULATORY_CARE_PROVIDER_SITE_OTHER): Payer: 59 | Admitting: Internal Medicine

## 2020-02-04 ENCOUNTER — Other Ambulatory Visit: Payer: Self-pay

## 2020-02-04 ENCOUNTER — Ambulatory Visit: Payer: 59 | Admitting: Physical Therapy

## 2020-02-04 DIAGNOSIS — Z1231 Encounter for screening mammogram for malignant neoplasm of breast: Secondary | ICD-10-CM | POA: Diagnosis not present

## 2020-02-04 DIAGNOSIS — Z1211 Encounter for screening for malignant neoplasm of colon: Secondary | ICD-10-CM

## 2020-02-04 DIAGNOSIS — J4520 Mild intermittent asthma, uncomplicated: Secondary | ICD-10-CM

## 2020-02-04 DIAGNOSIS — F331 Major depressive disorder, recurrent, moderate: Secondary | ICD-10-CM

## 2020-02-04 DIAGNOSIS — Z0000 Encounter for general adult medical examination without abnormal findings: Secondary | ICD-10-CM | POA: Diagnosis not present

## 2020-02-04 MED ORDER — BECLOMETHASONE DIPROPIONATE 40 MCG/ACT IN AERS: 1.0000 | INHALATION_SPRAY | Freq: Every day | RESPIRATORY_TRACT | 4 refills | Status: DC

## 2020-02-04 NOTE — Assessment & Plan Note (Signed)
She has been feeling better since last week. She has had some trouble keeping up with her appointments but has an appointment to establish with [facility] on 10/5 and will make sure to make the appointment. Missed last two PT appointments. She feels better as she has kept to herself and not been around anyone that triggers her. She does not want to discuss triggers as it can sometimes agitate her. She is not currently having feelings of wanting to hurt herself. She feels hopeful now that she has CCM and others helping her to go back home to [name].   - f/u with [facility] on 10/5 - she is to call clinic if she has acute episode or 911 if suicidal, she endorsed understanding.

## 2020-02-04 NOTE — Assessment & Plan Note (Signed)
-   f/u for PAP smear when able - never had a colonoscopy before, referral to GI placed  - last mamogram 2018. Screening mammogram ordered

## 2020-02-04 NOTE — Progress Notes (Signed)
   CC: depression  This is a telephone encounter between Lynn Pennington and Lynn Pennington on 02/04/2020 for depression. The visit was conducted with the patient located at home and Lynn Pennington at [facility]. The patient's identity was confirmed using their DOB and current address. The patient has consented to being evaluated through a telephone encounter and understands the associated risks (an examination cannot be done and the patient may need to come in for an appointment) / benefits (allows the patient to remain at home, decreasing exposure to coronavirus). I personally spent 15 minutes on medical discussion.   HPI:  [name] is a 49 y.o. with PMH as below presenting for follow-up of acute depression and anxiety.   Please see A&P for assessment of the patient's acute and chronic medical conditions.   Past Medical History:  Diagnosis Date  . Anxiety   . Asthma   . Chronic leg pain   . Depression   . DVT (deep venous thrombosis) ([place]) 2010, 2016  . Hypertension   . Iron deficiency    Review of Systems:   Review of Systems  Gastrointestinal: Negative for abdominal pain and nausea.  Neurological: Negative for dizziness and sensory change.  Psychiatric/Behavioral: Positive for depression. Negative for hallucinations, memory loss and suicidal ideas. The patient is not nervous/anxious.     Assessment & Plan:   See Encounters Tab for problem based charting.  Patient discussed with Dr. Jimmye Norman

## 2020-02-06 ENCOUNTER — Ambulatory Visit: Payer: 59

## 2020-02-06 DIAGNOSIS — J4520 Mild intermittent asthma, uncomplicated: Secondary | ICD-10-CM

## 2020-02-06 DIAGNOSIS — G8929 Other chronic pain: Secondary | ICD-10-CM

## 2020-02-06 DIAGNOSIS — M79604 Pain in right leg: Secondary | ICD-10-CM

## 2020-02-06 DIAGNOSIS — I10 Essential (primary) hypertension: Secondary | ICD-10-CM

## 2020-02-06 NOTE — Chronic Care Management (AMB) (Signed)
Care Management   Follow Up Note   02/06/2020 Name: Lynn Pennington MRN: [id number] DOB: 1970-12-18  Referred by: Lacinda Axon, MD Reason for referral : Care Coordination (Disability assistance)   Lynn Pennington is a 49 y.o. year old female who is a primary care patient of Amponsah, Charisse March, MD. The care management team was consulted for assistance with care management and care coordination needs.    Review of patient status, including review of consultants reports, relevant laboratory and other test results, and collaboration with appropriate care team members and the patient's provider was performed as part of comprehensive patient evaluation and provision of chronic care management services.    SDOH (Social Determinants of Health) assessments performed: No See Care Plan activities for detailed interventions related to [facility])     Advanced Directives: See Care Plan and Vynca application for related entries.   Goals Addressed              This Visit's Progress   .  "I need help applying for [place] Disability (pt-stated)        Current Barriers:  . Patient states that she received disability income when she lived in [name].  She would like to move back there but says that "is going to be a process"  Requesting assistance with applying for [place] disability income due to being in and out of work.   Case Manager Clinical Goal(s):  Marland Kitchen Over the next 90 days, patient will work with BSW to address needs related to Financial constraints related to being in and out of work due to chronic pain . Over the next 90 days, BSW will collaborate with RN Care Manager to address care management and care coordination needs  Interventions:  . Received signed consent from patient.   . Contacted patient to gather information needed for referral form for [place] . Submitted referral . Received confirmation that referral was received.   Patient Self Care Activities:  . Patient  verbalizes understanding of plan to complete and return consent form  Please see past updates related to this goal by clicking on the "Past Updates" button in the selected goal      .  "I want to get a medical transfer for my section 8 voucher so I can go back to [name]" (pt-stated)        Current Barriers:  . Patient moved to [place] approximately one year ago and states that she would prefer to move back to [name].  States that she has been in communication with caseworker at [name] about this bu they are in need of medical information.    Case Manager Clinical Goal(s):  Marland Kitchen Over the next 180 days, patient will work with BSW to address needs related to transfer of housing voucher to [name].  . Over the next 180 days, BSW will collaborate with RN Care Manager to address care management and care coordination needs  Interventions:  . Received signed consent from patient to collaborate with [facility].  . Requested that patient call me back when she is able to retrieve name and contact information for [facility] Caseworker  Patient Self Care Activities:  . Performs ADL's independently . Performs IADL's independently  Please see past updates related to this goal by clicking on the "Past Updates" button in the selected goal          The care management team will reach out to the patient again over the next three weeks.  Ronn Melena, [place] Coordination Social Worker [place] [phone]

## 2020-02-06 NOTE — Progress Notes (Signed)
Internal Medicine Clinic Resident  I have personally reviewed this encounter including the documentation in this note and/or discussed this patient with the care management provider. I will address any urgent items identified by the care management provider and will communicate my actions to the patient's PCP. I have reviewed the patient's CCM visit with my supervising attending, Dr Narendra.  Kessler Kopinski, MD  [facility] PGY-2 02/06/2020    

## 2020-02-06 NOTE — Patient Instructions (Signed)
Visit Information  Goals Addressed              This Visit's Progress   .  "I need help applying for [place] Disability (pt-stated)        Current Barriers:  . Patient states that she received disability income when she lived in [name].  She would like to move back there but says that "is going to be a process"  Requesting assistance with applying for [place] disability income due to being in and out of work.   Case Manager Clinical Goal(s):  Marland Kitchen Over the next 90 days, patient will work with BSW to address needs related to Financial constraints related to being in and out of work due to chronic pain . Over the next 90 days, BSW will collaborate with RN Care Manager to address care management and care coordination needs  Interventions:  . Received signed consent from patient.   . Contacted patient to gather information needed for referral form for [place] . Submitted referral . Received confirmation that referral was received.   Patient Self Care Activities:  . Patient verbalizes understanding of plan to complete and return consent form  Please see past updates related to this goal by clicking on the "Past Updates" button in the selected goal      .  "I want to get a medical transfer for my section 8 voucher so I can go back to [name]" (pt-stated)        Current Barriers:  . Patient moved to [place] approximately one year ago and states that she would prefer to move back to [name].  States that she has been in communication with caseworker at [name] about this bu they are in need of medical information.    Case Manager Clinical Goal(s):  Marland Kitchen Over the next 180 days, patient will work with BSW to address needs related to transfer of housing voucher to [name].  . Over the next 180 days, BSW will collaborate with RN Care Manager to address care management and care coordination needs  Interventions:  . Received signed consent from patient to  collaborate with [facility].  . Requested that patient call me back when she is able to retrieve name and contact information for [facility] Caseworker  Patient Self Care Activities:  . Performs ADL's independently . Performs IADL's independently  Please see past updates related to this goal by clicking on the "Past Updates" button in the selected goal         Patient verbalizes understanding of instructions provided today.   The care management team will reach out to the patient again over the next three weeks.    Ronn Melena, [place] Coordination Social Worker [place] [phone]

## 2020-02-10 ENCOUNTER — Ambulatory Visit (INDEPENDENT_AMBULATORY_CARE_PROVIDER_SITE_OTHER): Payer: 59 | Admitting: Clinical

## 2020-02-10 ENCOUNTER — Other Ambulatory Visit: Payer: Self-pay

## 2020-02-10 DIAGNOSIS — F331 Major depressive disorder, recurrent, moderate: Secondary | ICD-10-CM | POA: Diagnosis not present

## 2020-02-10 DIAGNOSIS — F4310 Post-traumatic stress disorder, unspecified: Secondary | ICD-10-CM

## 2020-02-10 NOTE — Progress Notes (Signed)
DOS 02/04/20:  Internal Medicine Clinic Attending  Case discussed with Dr. Sharon Seller  at the time of the visit.  I agree with the assessment, diagnosis, and plan of care documented in the resident's note.

## 2020-02-11 ENCOUNTER — Ambulatory Visit: Payer: 59 | Attending: Podiatry | Admitting: Physical Therapy

## 2020-02-11 ENCOUNTER — Telehealth: Payer: Self-pay | Admitting: Physical Therapy
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
## 2020-02-11 NOTE — Telephone Encounter (Signed)
Attempted to contact patient regarding no show to appointment. Mailbox full, unable to leave voicemail.

## 2020-02-13 NOTE — Progress Notes (Signed)
Internal Medicine Clinic Attending  CCM services provided by the care management provider and their documentation were discussed with Dr. Aslam. We reviewed the pertinent findings, urgent action items addressed by the resident and non-urgent items to be addressed by the PCP.  I agree with the assessment, diagnosis, and plan of care documented in the CCM and resident's note.  Neythan Kozlov, MD 02/13/2020  

## 2020-02-17 ENCOUNTER — Telehealth: Payer: Self-pay | Admitting: Licensed Clinical Social Worker

## 2020-02-17 ENCOUNTER — Encounter: Payer: Self-pay | Admitting: *Deleted

## 2020-02-17 DIAGNOSIS — F331 Major depressive disorder, recurrent, moderate: Secondary | ICD-10-CM

## 2020-02-17 NOTE — Telephone Encounter (Signed)
Left message encouraging contact 

## 2020-02-17 NOTE — BH Specialist Note (Signed)
Integrated Behavioral Health Comprehensive Clinical Assessment  MRN: [id number] Name: Kenzi Bardwell  Total time: 45   Type of Service: Integrated Behavioral Health-Individual Interpretor: No.   PRESENTING CONCERNS: Kattie Santoyo is a 49 y.o. female accompanied by Patient was at her PCP appointment.  PCP contacted [facility] to ensure safety and to begin [place]. Matraca Hunkins was referred to [facility] clinician for depression.  Previous mental health services Have you ever been treated for a mental health problem? Yes If "Yes", when were you treated and whom did you see? "I am not sure" Have you ever been hospitalized for mental health treatment? No Have you ever been treated for any of the following? Past Psychiatric History/Hospitalization(s): Anxiety: Yes Bipolar Disorder: No Depression: Yes Mania: No Psychosis: No Schizophrenia: No Personality Disorder: No Hospitalization for psychiatric illness: No History of Electroconvulsive Shock Therapy: No Prior Suicide Attempts: No Have you ever had thoughts of harming yourself or others or attempted suicide? No plan to harm self or others; history of SI thoughts; passive  Medical history  has a past medical history of Anxiety, Asthma, Chronic leg pain, Depression, DVT (deep venous thrombosis) ([place]) (2010, 2016), Hypertension, and Iron deficiency. Primary Care Physician: Lacinda Axon, MD Date of last physical exam:01/28/20 Allergies: No Known Allergies Current medications:  Outpatient Encounter Medications as of 02/17/2020  Medication Sig  . beclomethasone (QVAR) 40 MCG/ACT inhaler Inhale 1 puff into the lungs daily. Pt unsure of dosage  . hydrochlorothiazide (HYDRODIURIL) 25 MG tablet Take 25 mg by mouth every morning.  . hydrOXYzine (ATARAX/VISTARIL) 25 MG tablet Take 1 tablet (25 mg total) by mouth every 6 (six) hours.  . methylPREDNISolone (MEDROL DOSEPAK) 4 MG TBPK tablet Take taper as directed in package  instructions for heel pain  . rivaroxaban (XARELTO) 20 MG TABS tablet Take 1 tablet (20 mg total) by mouth daily with supper.  . sertraline (ZOLOFT) 50 MG tablet TAKE 1 TABLET(50 MG) BY MOUTH DAILY   No facility-administered encounter medications on file as of 02/17/2020.   Have you ever had any serious medication reactions? No Is there any history of mental health problems or substance abuse in your family? Yes- depression; undiagnosed Has anyone in your family been hospitalized for mental health treatment? No  Social/family history Who lives in your current household? "I live alone." What is your family of origin, childhood history? I had a normal childhood  Employment/financial issues Currently unemployed.  Difficulty sustaining work  Sleep Decreased   Substance use Denies   Diagnosis   ICD-10-CM   1. Moderate episode of recurrent major depressive disorder (HCC)  F33.1     GOALS ADDRESSED: Patient will reduce symptoms of: depression and increase knowledge and/or ability of: coping skills, healthy habits, self-management skills and stress reduction and also: Increase healthy adjustment to current life circumstances, Increase adequate support systems for patient/family and Improve medication compliance              INTERVENTIONS: Interventions utilized: Solution-Focused Strategies   ASSESSMENT/OUTCOME: Christinna is a 49 year old woman that was referred by her PCP for an urgent [facility] session.  Patient has a history of passive suicidal thoughts.  Eupha reports poor mood, sadness, crying spells, isolation, fatigue and lack of motivation.  She is currently unemployed.  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  Worked for approximately 2 weeks; lack of motivation. Recently moved to the area about 1 year ago.  No friends or significant other. Reports that she has not engaged  in friendships or romantic relationship in years. Throughout the call, Patient was tearful and unmotivated.  Educated  Patient on depression and creating a schedule to complete mini task throughout the day. Provided psychoeducation on coping skills. Will follow up tomorrow  PLAN: Follow up with Patient tomorrow 01/29/20 and weekly ongoing appointments until traditional therapy is set up.  Patient will contact [facility] line [phone] if symptoms worsen  Scheduled next visit: 01/29/20  [place]

## 2020-02-17 NOTE — Telephone Encounter (Signed)
Attempted to contact patient for follow up from yesterday.  No answer.  Left message encouraging contact.

## 2020-02-18 ENCOUNTER — Encounter: Payer: 59 | Admitting: Student

## 2020-02-18 ENCOUNTER — Ambulatory Visit (INDEPENDENT_AMBULATORY_CARE_PROVIDER_SITE_OTHER): Payer: 59 | Admitting: Internal Medicine

## 2020-02-18 ENCOUNTER — Other Ambulatory Visit: Payer: Self-pay

## 2020-02-18 ENCOUNTER — Ambulatory Visit: Payer: 59

## 2020-02-18 ENCOUNTER — Encounter: Payer: Self-pay | Admitting: Internal Medicine

## 2020-02-18 VITALS — BP 149/89 | HR 64 | Wt 235.8 lb

## 2020-02-18 DIAGNOSIS — R55 Syncope and collapse: Secondary | ICD-10-CM | POA: Diagnosis not present

## 2020-02-18 DIAGNOSIS — J4520 Mild intermittent asthma, uncomplicated: Secondary | ICD-10-CM

## 2020-02-18 DIAGNOSIS — G8929 Other chronic pain: Secondary | ICD-10-CM

## 2020-02-18 DIAGNOSIS — I10 Essential (primary) hypertension: Secondary | ICD-10-CM | POA: Diagnosis not present

## 2020-02-18 DIAGNOSIS — Z0001 Encounter for general adult medical examination with abnormal findings: Secondary | ICD-10-CM | POA: Diagnosis not present

## 2020-02-18 DIAGNOSIS — R5383 Other fatigue: Secondary | ICD-10-CM

## 2020-02-18 DIAGNOSIS — M79605 Pain in left leg: Secondary | ICD-10-CM

## 2020-02-18 DIAGNOSIS — Z0000 Encounter for general adult medical examination without abnormal findings: Secondary | ICD-10-CM

## 2020-02-18 NOTE — Assessment & Plan Note (Signed)
Blood pressure is above goal in the clinic today. She has not been taking the hctz 25mg . She notes that her home blood pressures range 286N-817R systolically. I instructed her to record home blood pressures x2w and to arrange an appt at which we can review those at that time.

## 2020-02-18 NOTE — Assessment & Plan Note (Signed)
Pelvic exam performed at today's visit. Pt reports history of hysterectomy however does not know if this was a partial or complete. No cervix is visualized on exam today.

## 2020-02-18 NOTE — Progress Notes (Signed)
Acute Office Visit   Patient ID: Lynn Pennington, female    DOB: Sep 26, 1970, 49 y.o.   MRN: [id number]  Subjective:  CC: syncope, fatigue, pelvic exam  HPI 49 y.o. presents today for pelvic exam and near syncope.  No GU symptoms today. Patient reports a history of hysterectomy however is unclear if it was complete or partial.   She also reports three incidents of near syncope that have occurred over the past 2 months. 2 of these occurred in the setting of going from sitting to standing. The other occurred while walking around [employer]. She describes the episodes as her feeling flushed and getting tunnel vision. Duration approx 2-5 minutes. She denies palpitations or chest pain during these episodes. She feels that her fluid and nutrition intake has been appropriate. She reports that she has not been taking her prescribed hydrochlorothiazide. She notes that her home blood pressures range in the low one teens to 120s.   She recently restarted zoloft about a month ago but did not have symptoms when on it previously.  Regarding the fatigue, she relates it to some past symptoms that she has had at which time she ended up receiving some iron transfusions. She is unsure what this was for. She notes that she has been told she snores in the past and does wake up frequently at night. She does not have a sleeping partner so does not know if she stops breathing at night.  She also wanted to discuss chronic leg pain however, due to this being scheduled as a visit for a pelvic exam, I encouraged that she arrange a separate appt.       ACTIVE MEDICATIONS   Current Outpatient Medications on File Prior to Visit  Medication Sig Dispense Refill  . beclomethasone (QVAR) 40 MCG/ACT inhaler Inhale 1 puff into the lungs daily. Pt unsure of dosage 1 each 4  . hydrochlorothiazide (HYDRODIURIL) 25 MG tablet Take 25 mg by mouth every morning.    . hydrOXYzine (ATARAX/VISTARIL) 25 MG tablet Take 1 tablet (25 mg  total) by mouth every 6 (six) hours. 12 tablet 0  . rivaroxaban (XARELTO) 20 MG TABS tablet Take 1 tablet (20 mg total) by mouth daily with supper. 30 tablet 2  . sertraline (ZOLOFT) 50 MG tablet TAKE 1 TABLET(50 MG) BY MOUTH DAILY 90 tablet 3   No current facility-administered medications on file prior to visit.    ROS  Review of Systems  Constitutional: Positive for fatigue. Negative for chills and fever.  Respiratory: Negative for cough, chest tightness and shortness of breath.   Cardiovascular: Positive for leg swelling. Negative for chest pain and palpitations.  Genitourinary: Negative for dysuria, pelvic pain, vaginal discharge and vaginal pain.  Musculoskeletal: Positive for arthralgias.  Neurological: Positive for light-headedness. Negative for dizziness, syncope and headaches.    Objective:   BP (!) 149/89 (BP Location: Left Arm, Patient Position: Sitting, Cuff Size: Normal)   Pulse 64   Wt 235 lb 12.8 oz (107 kg)   SpO2 100%   BMI 40.47 kg/m  Wt Readings from Last 3 Encounters:  02/18/20 235 lb 12.8 oz (107 kg)  01/28/20 237 lb 3.2 oz (107.6 kg)  11/13/19 227 lb 14.4 oz (103.4 kg)   BP Readings from Last 3 Encounters:  02/18/20 (!) 149/89  01/28/20 (!) 143/84  11/20/19 132/88   Physical Exam Constitutional:      Appearance: Normal appearance. She is not ill-appearing.  Cardiovascular:     Rate and Rhythm: Normal  rate and regular rhythm.     Pulses: Normal pulses.  Pulmonary:     Effort: Pulmonary effort is normal.     Breath sounds: Normal breath sounds.  Genitourinary:    Comments: Pelvic Exam: Chaparoned by Carlyon Shadow, RN.  normal external genitalia. Normal vaginal mucosa. No vaginal discharge. No cervix visualized on exam. No masses appreciated with palpation of the ovaries. Skin:    General: Skin is warm.     Coloration: Skin is not pale.  Neurological:     Mental Status: She is alert.     Health Maintenance:   Health Maintenance  Topic Date Due  .  COVID-19 Vaccine (1) Never done  . TETANUS/TDAP  Never done  . PAP SMEAR-Modifier  Never done  . MAMMOGRAM  Never done  . COLONOSCOPY  Never done  . INFLUENZA VACCINE  Never done  . Hepatitis C Screening  Completed  . HIV Screening  Completed     Assessment & Plan:   Problem List Items Addressed This Visit      Cardiovascular and Mediastinum   Hypertension    Blood pressure is above goal in the clinic today. She has not been taking the hctz 25mg . She notes that her home blood pressures range 102V-253G systolically. I instructed her to record home blood pressures x2w and to arrange an appt at which we can review those at that time.         Other   Healthcare maintenance    Pelvic exam performed at today's visit. Pt reports history of hysterectomy however does not know if this was a partial or complete. No cervix is visualized on exam today.      Syncope and Fatigue    HPI: IOV for three incidents of near syncope that have occurred over the past 2 months. 2 of these occurred in the setting of going from sitting to standing. The other occurred while walking around [employer]. She describes the episodes as her feeling flushed and getting tunnel vision. Duration approx 2-5 minutes. She denies palpitations or chest pain during these episodes. She feels that her fluid and nutrition intake has been appropriate. She reports that she has not been taking her prescribed hydrochlorothiazide. She notes that her home blood pressures range in the low one teens to 120s.   She recently restarted zoloft about a month ago but did not have symptoms when on it previously. Assessment:  -orthostatic vital signs are wnl at today's visit so orthostasis is unlikely.  -She does have a history of needing iron transfusions which I presume is in the setting of anemia. The syncope and fatigue would be consistent with anemia. Last CBC was in July at which time hgb was normal so this would be an acute anemia however  she denies any obvious sources of blood loss. -I considered medications as well. She recently restarted zoloft which could cause this however did not have similar symptoms when on it in the past. --I considered cardiac etiology however would consider this less likely. --OSA considered. STOP-BANG score 5 indicating high risk. Plan --cbc to evaluate for anemia --BMP to evaluate for electrolyte derangement --if syncopal episodes persist or worsen, event monitor could be considered however, at the time of this visit, I feel that the infrequency of the episodes would reduce the chance of capturing this --re-evaluate at follow up appt. If no improvement in fatigue, consider sleep study to evaluate for OSA --will have her try taking the zoloft at night to see if  that improves symptoms.       Other Visit Diagnoses    Fatigue, unspecified type    -  Primary   Relevant Orders   CBC no Diff   Basic metabolic panel        Pt discussed with Dr. Marty Heck, MD Internal Medicine Resident PGY-2 [facility] Pager: [phone] 02/18/2020 8:54 PM

## 2020-02-18 NOTE — Assessment & Plan Note (Addendum)
HPI: IOV for three incidents of near syncope that have occurred over the past 2 months. 2 of these occurred in the setting of going from sitting to standing. The other occurred while walking around [employer]. She describes the episodes as her feeling flushed and getting tunnel vision. Duration approx 2-5 minutes. She denies palpitations or chest pain during these episodes. She feels that her fluid and nutrition intake has been appropriate. She reports that she has not been taking her prescribed hydrochlorothiazide. She notes that her home blood pressures range in the low one teens to 120s.   She recently restarted zoloft about a month ago but did not have symptoms when on it previously. Assessment:  -orthostatic vital signs are wnl at today's visit so orthostasis is unlikely.  -She does have a history of needing iron transfusions which I presume is in the setting of anemia. The syncope and fatigue would be consistent with anemia. Last CBC was in July at which time hgb was normal so this would be an acute anemia however she denies any obvious sources of blood loss. -I considered medications as well. She recently restarted zoloft which could cause this however did not have similar symptoms when on it in the past. --I considered cardiac etiology however would consider this less likely. --OSA considered. STOP-BANG score 5 indicating high risk. Plan --cbc to evaluate for anemia --BMP to evaluate for electrolyte derangement --if syncopal episodes persist or worsen, event monitor could be considered however, at the time of this visit, I feel that the infrequency of the episodes would reduce the chance of capturing this --re-evaluate at follow up appt. If no improvement in fatigue, consider sleep study to evaluate for OSA --will have her try taking the zoloft at night to see if that improves symptoms.

## 2020-02-18 NOTE — Patient Instructions (Signed)
Visit Information  Goals Addressed              This Visit's Progress   .  "I need help applying for [place] Disability (pt-stated)        Current Barriers:  . Patient states that she received disability income when she lived in [name].  She would like to move back there but says that "is going to be a process"  Requesting assistance with applying for [place] disability income due to being in and out of work.   Case Manager Clinical Goal(s):  Marland Kitchen Over the next 90 days, patient will work with BSW to address needs related to Financial constraints related to being in and out of work due to chronic pain . Over the next 90 days, BSW will collaborate with RN Care Manager to address care management and care coordination needs  Interventions:  . Received message from Dustin Flock with Disability Assistance Program that she has been unable to contact patient at numbers provided on referral form.  . Reviewed referral form and noted that wrong mobile number was mistakenly entered on form . Provided Ms. Pearline Cables with correct mobile number . Attempted to contact patient today for follow up but no answer and voicemail box was full.  .   Patient Self Care Activities:  . Patient verbalizes understanding of plan to complete and return consent form  Please see past updates related to this goal by clicking on the "Past Updates" button in the selected goal           Telephone follow up appointment with care management team member scheduled for:02/27/20    Ronn Melena, [place] Coordination Social Worker [place] [phone]

## 2020-02-18 NOTE — Chronic Care Management (AMB) (Signed)
  Care Management   Follow Up Note   02/18/2020 Name: Nakoma Gotwalt MRN: [id number] DOB: [birth year]  Referred by: Mitzi Hansen, MD Reason for referral : Care Coordination (referral to disability assistance program. )   Kelseigh Diver is a 49 y.o. year old female who is a primary care patient of Christian, Lucinda Dell, MD. The care management team was consulted for assistance with care management and care coordination needs.    Review of patient status, including review of consultants reports, relevant laboratory and other test results, and collaboration with appropriate care team members and the patient's provider was performed as part of comprehensive patient evaluation and provision of chronic care management services.    SDOH (Social Determinants of Health) assessments performed: No See Care Plan activities for detailed interventions related to [facility])     Advanced Directives: See Care Plan and Vynca application for related entries.   Goals Addressed              This Visit's Progress   .  "I need help applying for [place] Disability (pt-stated)        Current Barriers:  . Patient states that she received disability income when she lived in [name].  She would like to move back there but says that "is going to be a process"  Requesting assistance with applying for [place] disability income due to being in and out of work.   Case Manager Clinical Goal(s):  Marland Kitchen Over the next 90 days, patient will work with BSW to address needs related to Financial constraints related to being in and out of work due to chronic pain . Over the next 90 days, BSW will collaborate with RN Care Manager to address care management and care coordination needs  Interventions:  . Received message from Dustin Flock with Disability Assistance Program that she has been unable to contact patient at numbers provided on referral form.  . Reviewed referral form and noted that wrong mobile number was mistakenly entered on  form . Provided Ms. Pearline Cables with correct mobile number . Attempted to contact patient today for follow up but no answer and voicemail box was full.  .   Patient Self Care Activities:  . Patient verbalizes understanding of plan to complete and return consent form  Please see past updates related to this goal by clicking on the "Past Updates" button in the selected goal          Telephone follow up appointment with care management team member scheduled for:02/27/20    Ronn Melena, [place] Coordination Social Worker [place] [phone]

## 2020-02-19 ENCOUNTER — Telehealth: Payer: Self-pay | Admitting: Internal Medicine

## 2020-02-19 NOTE — Progress Notes (Addendum)
Internal Medicine Clinic Resident  I have personally reviewed this encounter including the documentation in this note and/or discussed this patient with the care management provider. I will address any urgent items identified by the care management provider and will communicate my actions to the patient's PCP. I have reviewed the patient's CCM visit with my supervising attending, Dr Williams.  Graeson Nouri D Evelynn Hench, DO 02/19/2020    Internal Medicine Attending attestation: I agree with the findings and plan of this CCM encounter. Julie Williams, MD  

## 2020-02-20 NOTE — Progress Notes (Signed)
Internal Medicine Clinic Attending  Case discussed with Dr. Christian  At the time of the visit.  We reviewed the resident's history and exam and pertinent patient test results.  I agree with the assessment, diagnosis, and plan of care documented in the resident's note.  

## 2020-02-20 NOTE — Telephone Encounter (Signed)
Received message from pcp that pt left without getting her labs drawn during her recent appt.  CMA attempted to contact pt at both home and cell number, no answer-message left on recorder for return call to schedule lab only visit.Despina Hidden Cassady10/15/202112:13 PM

## 2020-02-21 ENCOUNTER — Other Ambulatory Visit: Payer: Self-pay | Admitting: Student

## 2020-02-21 DIAGNOSIS — Z86718 Personal history of other venous thrombosis and embolism: Secondary | ICD-10-CM

## 2020-02-23 ENCOUNTER — Other Ambulatory Visit (INDEPENDENT_AMBULATORY_CARE_PROVIDER_SITE_OTHER): Payer: 59

## 2020-02-23 ENCOUNTER — Other Ambulatory Visit: Payer: Self-pay

## 2020-02-23 DIAGNOSIS — R5383 Other fatigue: Secondary | ICD-10-CM

## 2020-02-24 LAB — CBC
Hematocrit: 37.6 % (ref 34.0–46.6)
Hemoglobin: 12.7 g/dL (ref 11.1–15.9)
MCH: 32.3 pg (ref 26.6–33.0)
MCHC: 33.8 g/dL (ref 31.5–35.7)
MCV: 96 fL (ref 79–97)
Platelets: 214 10*3/uL (ref 150–450)
RBC: 3.93 x10E6/uL (ref 3.77–5.28)
RDW: 12.5 % (ref 11.7–15.4)
WBC: 6.8 10*3/uL (ref 3.4–10.8)

## 2020-02-24 LAB — BASIC METABOLIC PANEL
BUN/Creatinine Ratio: 15 (ref 9–23)
BUN: 12 mg/dL (ref 6–24)
CO2: 22 mmol/L (ref 20–29)
Calcium: 9.4 mg/dL (ref 8.7–10.2)
Chloride: 105 mmol/L (ref 96–106)
Creatinine, Ser: 0.82 mg/dL (ref 0.57–1.00)
GFR calc Af Amer: 96 mL/min/{1.73_m2} (ref >59)
GFR calc non Af Amer: 83 mL/min/{1.73_m2} (ref >59)
Glucose: 98 mg/dL (ref 65–99)
Potassium: 4 mmol/L (ref 3.5–5.2)
Sodium: 141 mmol/L (ref 134–144)

## 2020-02-27 ENCOUNTER — Ambulatory Visit: Payer: 59

## 2020-02-27 DIAGNOSIS — I10 Essential (primary) hypertension: Secondary | ICD-10-CM

## 2020-02-27 DIAGNOSIS — G8929 Other chronic pain: Secondary | ICD-10-CM

## 2020-02-27 DIAGNOSIS — J4520 Mild intermittent asthma, uncomplicated: Secondary | ICD-10-CM

## 2020-02-27 DIAGNOSIS — M79605 Pain in left leg: Secondary | ICD-10-CM

## 2020-02-27 NOTE — Patient Instructions (Signed)
Visit Information  Goals Addressed              This Visit's Progress   .  COMPLETED: "I need help applying for [place] Disability (pt-stated)        Current Barriers:  . Patient states that she received disability income when she lived in [name].  She would like to move back there but says that "is going to be a process"  Requesting assistance with applying for [name] disability income due to being in and out of work.   Case Manager Clinical Goal(s):  Marland Kitchen Over the next 90 days, patient will work with BSW to address needs related to Financial constraints related to being in and out of work due to chronic pain . Over the next 90 days, BSW will collaborate with RN Care Manager to address care management and care coordination needs  Interventions:  . Closing goal of care plan as patient states that she received call from [employer] and has filed for disability.   .   Patient Self Care Activities:  . Patient verbalizes understanding of plan to complete and return consent form  Please see past updates related to this goal by clicking on the "Past Updates" button in the selected goal      .  "I want to get a medical transfer for my section 8 voucher so I can go back to [name]" (pt-stated)        Current Barriers:  . Patient moved to  approximately one year ago and states that she would prefer to move back to [name].  States that she has been in communication with caseworker at [name] about this bu they are in need of medical information.   . 02/27/20:  Patient still requesting assistance with "medical transfer" to [facility] through [name].  She was informed recently that she does not have a caseworker so request would need to go through "a supervisor"  Case Manager Clinical Goal(s):  Marland Kitchen Over the next 180 days, patient will work with BSW to address needs related to transfer of housing voucher to [name].  . Over the next  180 days, BSW will collaborate with RN Care Manager to address care management and care coordination needs  Interventions:  . Informed patient that CCM BSW is not familiar with the process for transfer through [name] and unsure of how to assist with relocation to [name] . Agreed to send letter to patient stating that she is requesting a "medical transfer to [name]" that she will give to supervisor of [name].     Patient Self Care Activities:  . Performs ADL's independently . Performs IADL's independently  Please see past updates related to this goal by clicking on the "Past Updates" button in the selected goal         Patient verbalizes understanding of instructions provided today.   The patient has been provided with contact information for the care management team and has been advised to call with any health related questions or concerns.      Lynn Pennington, [place] Coordination Social Worker [place] [phone]

## 2020-02-27 NOTE — Chronic Care Management (AMB) (Signed)
Care Management   Follow Up Note   02/27/2020 Name: Lynn Pennington MRN: [id number] DOB: [birth year]  Referred by: Mitzi Hansen, MD Reason for referral : Care Coordination (disability assistance, housing transfer)   Lynn Pennington is a 49 y.o. year old female who is a primary care patient of Christian, Lucinda Dell, MD. The care management team was consulted for assistance with care management and care coordination needs.    Review of patient status, including review of consultants reports, relevant laboratory and other test results, and collaboration with appropriate care team members and the patient's provider was performed as part of comprehensive patient evaluation and provision of chronic care management services.    SDOH (Social Determinants of Health) assessments performed: No See Care Plan activities for detailed interventions related to [facility])     Advanced Directives: See Care Plan and Vynca application for related entries.   Goals Addressed              This Visit's Progress   .  COMPLETED: "I need help applying for [place] Disability (pt-stated)        Current Barriers:  . Patient states that she received disability income when she lived in [name].  She would like to move back there but says that "is going to be a process"  Requesting assistance with applying for [place] disability income due to being in and out of work.   Case Manager Clinical Goal(s):  Marland Kitchen Over the next 90 days, patient will work with BSW to address needs related to Financial constraints related to being in and out of work due to chronic pain . Over the next 90 days, BSW will collaborate with RN Care Manager to address care management and care coordination needs  Interventions:  . Closing goal of care plan as patient states that she received call from [employer] and has filed for disability.   .   Patient Self Care Activities:  . Patient verbalizes understanding of plan to complete and  return consent form  Please see past updates related to this goal by clicking on the "Past Updates" button in the selected goal      .  "I want to get a medical transfer for my section 8 voucher so I can go back to [name]" (pt-stated)        Current Barriers:  . Patient moved to [place] approximately one year ago and states that she would prefer to move back to [name].  States that she has been in communication with caseworker at [name] about this bu they are in need of medical information.   . 02/27/20:  Patient still requesting assistance with "medical transfer" to [facility] through [name].  She was informed recently that she does not have a caseworker so request would need to go through "a supervisor"  Case Manager Clinical Goal(s):  Marland Kitchen Over the next 180 days, patient will work with BSW to address needs related to transfer of housing voucher to [name].  . Over the next 180 days, BSW will collaborate with RN Care Manager to address care management and care coordination needs  Interventions:  . Informed patient that CCM BSW is not familiar with the process for transfer through [name] and unsure of how to assist with relocation to [name] . Agreed to send letter to patient stating that she is requesting a "medical transfer to [name]" that she will give to supervisor of [name].     Patient Self Care Activities:  .  Performs ADL's independently . Performs IADL's independently  Please see past updates related to this goal by clicking on the "Past Updates" button in the selected goal          The patient has been provided with contact information for the care management team and has been advised to call with any health related questions or concerns.     Ronn Melena, [place] Coordination Social Worker [place] [phone]

## 2020-03-08 NOTE — Progress Notes (Signed)
Internal Medicine Clinic Resident  I have personally reviewed this encounter including the documentation in this note and/or discussed this patient with the care management provider. I will address any urgent items identified by the care management provider and will communicate my actions to the patient's PCP. I have reviewed the patient's CCM visit with my supervising attending, Dr Evette Doffing.  Marianna Payment, MD 03/08/2020

## 2020-03-08 NOTE — Progress Notes (Signed)
Internal Medicine Clinic Attending  CCM services provided by the care management provider and their documentation were discussed with Dr. Marianna Payment. We reviewed the pertinent findings, urgent action items addressed by the resident and non-urgent items to be addressed by the PCP.  I agree with the assessment, diagnosis, and plan of care documented in the CCM and resident's note.  Axel Filler, MD 03/08/2020

## 2020-03-09 ENCOUNTER — Other Ambulatory Visit: Payer: Self-pay | Admitting: Student

## 2020-03-09 ENCOUNTER — Telehealth: Payer: Self-pay | Admitting: *Deleted

## 2020-03-09 DIAGNOSIS — Z86718 Personal history of other venous thrombosis and embolism: Secondary | ICD-10-CM

## 2020-03-09 NOTE — Telephone Encounter (Signed)
Patient requested refill of 20 mg Xarelto.  Called and discussed reducing dose of Xarelto with patient. Her last DVT was in 2016 and she has been on Xarelto since.  Will prescribe reduced dose, 10 mg Xarelto daily prophylaxis.  Change based on [facility] choice trial.  Patient reports being out of Xarelto and unable to pay for her medication.  She will call her case manager to request help with paying  for medication

## 2020-03-09 NOTE — Telephone Encounter (Signed)
  Care Management   Note  03/09/2020 Name: Tonnia Bardin MRN: [id number] DOB: 31-Jan-1971  Attempted to reach patient at request of Amber Chrismon CCM BSW to assist with questions patient had related to medications.  A HIPAA compliant phone message was left for the patient providing contact information and requesting a return call.   Kelli Churn RN, CCM, [facility] RN Care Manager [phone]

## 2020-03-11 ENCOUNTER — Telehealth: Payer: Self-pay | Admitting: Internal Medicine

## 2020-03-11 MED ORDER — RIVAROXABAN 10 MG PO TABS: 10.0000 mg | ORAL_TABLET | Freq: Every day | ORAL | 0 refills | Status: DC

## 2020-03-11 NOTE — Telephone Encounter (Signed)
-  Added sample of Xarelto to medication list

## 2020-03-11 NOTE — Telephone Encounter (Signed)
I spoke to patient today and she states that she is coming in today to sign the [employer] patient assitance paperwork.  I left the patient portion for her to sign at the reception area, they are aware that she will be in today.  Patient indicated that she didn't think she had used the Xarelto free trial card yet, but the 10mg  strength is not eligible for this free trial unfortunately.  **Patient also asked if she could use Lovenox that she has left over from a surgery she had until she can get assistance for the Xarelto because she has been out for 6 days.  I told her I would ask her provider to advise-please contact patient regarding use of Lovenox.

## 2020-03-11 NOTE — Telephone Encounter (Signed)
The office does have samples of Xarelto 10mg  that we will provide to the patient when she arrives to sign the application-front office staff is aware.  Hopefully this will get her through the application processing time needed.

## 2020-03-11 NOTE — Telephone Encounter (Signed)
Have read notes and agree with plan to obtain Xarelto through clinic.

## 2020-03-11 NOTE — Telephone Encounter (Signed)
Medication Samples have been provided to the patient.  Drug name: xarelto      Strength: 10 mg  Qty: 14 tablet  LOT: [id number] Exp.Date: 3/23  Dosing instructions: Take one tablet daily   The patient has been instructed regarding the correct time, dose, and frequency of taking this medication, including desired effects and most common side effects.   Lorene Dy 2:07 PM 03/11/2020

## 2020-03-12 ENCOUNTER — Ambulatory Visit: Payer: 59

## 2020-03-12 DIAGNOSIS — G8929 Other chronic pain: Secondary | ICD-10-CM

## 2020-03-12 DIAGNOSIS — I10 Essential (primary) hypertension: Secondary | ICD-10-CM

## 2020-03-12 DIAGNOSIS — M79605 Pain in left leg: Secondary | ICD-10-CM

## 2020-03-12 DIAGNOSIS — J4520 Mild intermittent asthma, uncomplicated: Secondary | ICD-10-CM

## 2020-03-12 NOTE — Progress Notes (Signed)
Internal Medicine Clinic Resident  I have personally reviewed this encounter including the documentation in this note and/or discussed this patient with the care management provider. I will address any urgent items identified by the care management provider and will communicate my actions to the patient's PCP. I have reviewed the patient's CCM visit with my supervising attending, Dr Heber [place].  [place], DO 03/12/2020

## 2020-03-12 NOTE — Patient Instructions (Signed)
Visit Information  Goals Addressed              This Visit's Progress   .  COMPLETED: "I want to get a medical transfer for my section 8 voucher so I can go back to [place]" (pt-stated)        Current Barriers:  . Patient moved to [place] approximately one year ago and states that she would prefer to move back to [place].  States that she has been in communication with caseworker at [employer] about this bu they are in need of medical information.   . 02/27/20:  Patient still requesting assistance with "medical transfer" to [facility] through [employer].  She was informed recently that she does not have a caseworker so request would need to go through "a supervisor"  Case Manager Clinical Goal(s):  Marland Kitchen Over the next 180 days, patient will work with BSW to address needs related to transfer of housing voucher to [place].  . Over the next 180 days, BSW will collaborate with RN Care Manager to address care management and care coordination needs  Interventions:  . Ensured that patient received letter (addressed to [employer]) that she requested indicating need for medical transfer to [place].    Patient Self Care Activities:  . Performs ADL's independently . Performs IADL's independently  Please see past updates related to this goal by clicking on the "Past Updates" button in the selected goal      .  COMPLETED: Patient told CCM social worker : "I need help applying for [place] disability and with transferring my section 8 housing voucher back to [place]." (pt-stated)        CARE PLAN ENTRY (see longitudinal plan of care for additional care plan information)   Current Barriers:  . Chronic Disease Management support, education, and care coordination needs related to HTN, Anxiety, Depression, and asthma and chronic pain - reviewed medical record for CCM RN assistance needs;  BP meeting targets of <140/<90, no documented asthma  exacerbations, no recent  ED visits or hospitalizations, receiving OP rehab for chronic leg pain,  no CCM RN needs identified so will not plan to make outreach to patient at this time  Case Manager Clinical Goal(s):  Marland Kitchen Over the next 90-180 days, patient will work with BSW to address needs related to Financial constraints related to no consistent income and requesting help with applying for [place] disability and with transfer of section 8 voucher to [place]  in patient with HTN, Anxiety, Depression, and asthma and chronic pain  Interventions:  . Ensured that patient received letter (addressed to [employer]) that she requested indicating need for medical transfer to [place].    Patient Self Care Activities:  . Patient verbalizes understanding of plan to CCM team to address disability and section 8 housing concerns . Self administers medications as prescribed . Attends all scheduled provider appointments . Calls pharmacy for medication refills . Performs ADL's independently . Performs IADL's independently . Calls provider office for new concerns or questions  Please see past updates related to this goal by clicking on the "Past Updates" button in the selected goal         Patient verbalizes understanding of instructions provided today.    Patient reports today that she met with CPhT, Mardee Postin, on 03/11/20 to complete medication assistance application and she received "seven days worth of medication" Patient's main concern at this time is relocating back to [place] because she feels that she  received better healthcare there.  Patient verbalizes understanding that CCM BSW cannot assist with relocation.  No further follow up at this time.      Ronn Melena, [place] Coordination Social Worker [place] [phone]

## 2020-03-12 NOTE — Chronic Care Management (AMB) (Signed)
Care Management   Follow Up Note   03/12/2020 Name: Lynn Pennington MRN: [id number] DOB: May 31, 1970  Referred by: Mitzi Hansen, MD Reason for referral : Care Coordination (community resources)   Lynn Pennington is a 49 y.o. year old female who is a primary care patient of Christian, Lucinda Dell, MD. The care management team was consulted for assistance with care management and care coordination needs.    Review of patient status, including review of consultants reports, relevant laboratory and other test results, and collaboration with appropriate care team members and the patient's provider was performed as part of comprehensive patient evaluation and provision of chronic care management services.    SDOH (Social Determinants of Health) assessments performed: No See Care Plan activities for detailed interventions related to [facility])     Advanced Directives: See Care Plan and [employer] application for related entries.   Goals Addressed              This Visit's Progress   .  COMPLETED: "I want to get a medical transfer for my section 8 voucher so I can go back to [name]" (pt-stated)        Current Barriers:  . Patient moved to [place] approximately one year ago and states that she would prefer to move back to [name].  States that she has been in communication with caseworker at [name] about this bu they are in need of medical information.   . 02/27/20:  Patient still requesting assistance with "medical transfer" to [facility] through [name].  She was informed recently that she does not have a caseworker so request would need to go through "a supervisor"  Case Manager Clinical Goal(s):  Lynn Pennington Kitchen Over the next 180 days, patient will work with BSW to address needs related to transfer of housing voucher to [name].  . Over the next 180 days, BSW will collaborate with RN Care Manager to address care management and care coordination needs  Interventions:    . Ensured that patient received letter (addressed to [name]) that she requested indicating need for medical transfer to [name].    Patient Self Care Activities:  . Performs ADL's independently . Performs IADL's independently  Please see past updates related to this goal by clicking on the "Past Updates" button in the selected goal      .  COMPLETED: Patient told CCM social worker : "I need help applying for [place] disability and with transferring my section 8 housing voucher back to [name]." (pt-stated)        CARE PLAN ENTRY (see longitudinal plan of care for additional care plan information)   Current Barriers:  . Chronic Disease Management support, education, and care coordination needs related to HTN, Anxiety, Depression, and asthma and chronic pain - reviewed medical record for CCM RN assistance needs;  BP meeting targets of <140/<90, no documented asthma exacerbations, no recent  ED visits or hospitalizations, receiving OP rehab for chronic leg pain,  no CCM RN needs identified so will not plan to make outreach to patient at this time  Case Manager Clinical Goal(s):  Lynn Pennington Kitchen Over the next 90-180 days, patient will work with BSW to address needs related to Financial constraints related to no consistent income and requesting help with applying for [place] disability and with transfer of section 8 voucher to [name]  in patient with HTN, Anxiety, Depression, and asthma and chronic pain  Interventions:  . Ensured that patient received letter (addressed to [name]) that she  requested indicating need for medical transfer to [name].    Patient Self Care Activities:  . Patient verbalizes understanding of plan to CCM team to address disability and section 8 housing concerns . Self administers medications as prescribed . Attends all scheduled provider appointments . Calls pharmacy for medication refills . Performs ADL's independently . Performs  IADL's independently . Calls provider office for new concerns or questions  Please see past updates related to this goal by clicking on the "Past Updates" button in the selected goal           Patient reports today that she met with CPhT, Cheryle Horsfall, on 03/11/20 to complete medication assistance application and she received "seven days worth of medication" Patient's main concern at this time is relocating back to [name] because she feels that she received better healthcare there.  Patient verbalizes understanding that CCM BSW cannot assist with relocation.  No further follow up at this time.      Ronn Melena, [place] Coordination Social Worker [place] [phone]

## 2020-03-16 ENCOUNTER — Encounter: Payer: Self-pay | Admitting: Nurse Practitioner

## 2020-03-16 ENCOUNTER — Ambulatory Visit (INDEPENDENT_AMBULATORY_CARE_PROVIDER_SITE_OTHER): Payer: 59 | Admitting: Nurse Practitioner

## 2020-03-16 ENCOUNTER — Telehealth: Payer: Self-pay

## 2020-03-16 VITALS — BP 132/72 | HR 84 | Ht 64.0 in | Wt 235.0 lb

## 2020-03-16 DIAGNOSIS — Z86718 Personal history of other venous thrombosis and embolism: Secondary | ICD-10-CM | POA: Diagnosis not present

## 2020-03-16 DIAGNOSIS — Z1211 Encounter for screening for malignant neoplasm of colon: Secondary | ICD-10-CM

## 2020-03-16 DIAGNOSIS — R140 Abdominal distension (gaseous): Secondary | ICD-10-CM | POA: Diagnosis not present

## 2020-03-16 DIAGNOSIS — R141 Gas pain: Secondary | ICD-10-CM

## 2020-03-16 MED ORDER — PLENVU 140 G PO SOLR: 1.0000 | ORAL | 0 refills | Status: DC

## 2020-03-16 NOTE — Telephone Encounter (Signed)
[facility] Pre-operative Risk Assessment     Request for surgical clearance:     Endoscopy Procedure  What type of surgery is being performed?     colonoscopy  When is this surgery scheduled?     04/14/2020  What type of clearance is required ?   Pharmacy  Are there any medications that need to be held prior to surgery and how long? Xarelto, 3-5 days  Practice name and name of physician performing surgery?      [facility]  What is your office phone and fax number?      Phone- [phone]  [phone]  Anesthesia type (None, local, MAC, general) ?       MAC

## 2020-03-16 NOTE — Progress Notes (Signed)
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 She reported seeing a hematologist in 2016 which resulted in a negative evaluation. She remains on Xarelto. Past left femur injury s/p surgery in 1995 and a repeat surgery to remove hardware in 1997. She recently moved from [place] to [place]. She presents to our office today to schedule a colonoscopy as referred by Dr. Dorian Pod. She complains of intermittent abdominal bloat. No significant abdominal pain. She is passing a normal formed brown BM daily. No rectal bleeding or black stools. No NSAID use. No other complaints today. She has not received Covid 19 vaccinations.   CBC Latest Ref Rng & Units 02/23/2020 11/13/2019 01/04/2019  WBC 3.4 - 10.8 x10E3/uL 6.8 7.8 6.8  Hemoglobin 11.1 - 15.9 g/dL 12.7 13.0 12.1  Hematocrit 34.0 - 46.6 % 37.6 39.3 37.3  Platelets 150 - 450 x10E3/uL 214 208 236   CMP Latest Ref Rng & Units 02/23/2020 11/20/2019 01/04/2019  Glucose 65 - 99 mg/dL 98 92 90  BUN 6 - 24 mg/dL 12 13 13   Creatinine 0.57 - 1.00 mg/dL 0.82 0.73 0.73  Sodium 134 - 144 mmol/L 141 145(H) 142  Potassium 3.5 - 5.2 mmol/L 4.0 4.0 3.3(L)  Chloride 96 - 106 mmol/L 105 108(H) 110  CO2 20 - 29 mmol/L 22 25 23   Calcium 8.7 - 10.2 mg/dL 9.4 9.3 9.1  Total Protein 6.5 - 8.1 g/dL - - 7.1  Total Bilirubin 0.3 - 1.2 mg/dL - - 0.5  Alkaline Phos 38 - 126 U/L - - 44  AST 15 - 41 U/L - - 16  ALT 0 - 44 U/L - - 15     Past Medical History:  Diagnosis Date  . Anxiety   . Asthma   . Chronic leg pain   . Depression   . DVT (deep venous thrombosis) ([place]) 2010, 2016  . Hypertension   . Iron deficiency    Past Surgical History:    Procedure Laterality Date  . ABDOMINAL HYSTERECTOMY    . FRACTURE SURGERY      Social History: She smokes cigarettes 1ppd > 10 years. No alcohol or drug use.   Family History: unknown, she lived in foster homes in her younger years.   No Known Allergies    Outpatient Encounter Medications as of 03/16/2020  Medication Sig  . beclomethasone (QVAR) 40 MCG/ACT inhaler Inhale 1 puff into the lungs daily. Pt unsure of dosage  . hydrochlorothiazide (HYDRODIURIL) 25 MG tablet Take 25 mg by mouth every morning.  . hydrOXYzine (ATARAX/VISTARIL) 25 MG tablet Take 1 tablet (25 mg total) by mouth every 6 (six) hours.  . rivaroxaban (XARELTO) 10 MG TABS tablet Take 1 tablet (10 mg total) by mouth daily.  . sertraline (ZOLOFT) 50 MG tablet TAKE 1 TABLET(50 MG) BY MOUTH DAILY  . [DISCONTINUED] rivaroxaban (XARELTO) 10 MG TABS tablet Take 1 tablet (10 mg total) by mouth daily.   No facility-administered encounter medications on file as of 03/16/2020.    REVIEW OF SYSTEMS:  Gen: Denies fever, sweats or chills. No weight loss.  CV: Denies chest pain, palpitations or edema. Resp: Denies cough, shortness of breath of  hemoptysis.  GI: See HPI. No GERD symptoms.  GU : Denies urinary burning, blood in urine, increased urinary frequency or incontinence. MS: Arthritis, chronic left leg pain, wears a brace most days.  Derm: Denies rash, itchiness, skin lesions or unhealing ulcers. Psych: + anxiety and depression. Heme: Denies bruising, bleeding. Neuro:  Denies headaches, dizziness or paresthesias. Endo:  Denies any problems with DM, thyroid or adrenal function.   PHYSICAL EXAM: BP 132/72   Pulse 84   Ht 5\' 4"  (1.626 m)   Wt 235 lb (106.6 kg)   BMI 40.34 kg/m  General: Well developed 49 year old female in no acute distress. Head: Normocephalic and atraumatic. Eyes:  Sclerae non-icteric, conjunctive pink. Ears: Normal auditory acuity. Mouth: Dentition intact. No ulcers or lesions.  Neck: Supple,  no lymphadenopathy or thyromegaly.  Lungs: Clear bilaterally to auscultation without wheezes, crackles or rhonchi. Heart: Regular rate and rhythm. No murmur, rub or gallop appreciated.  Abdomen: Soft, nontender, non distended. No masses. No hepatosplenomegaly. Normoactive bowel sounds x 4 quadrants.  Rectal:  Musculoskeletal: Symmetrical with no gross deformities. Skin: Warm and dry. No rash or lesions on visible extremities. Extremities: LLE edema.  Neurological: Alert oriented x 4, no focal deficits.  Psychological:  Alert and cooperative. Normal mood and affect.  ASSESSMENT AND PLAN:  44. 49 year old female presents for colon cancer screening -Colonoscopy benefits and risks discussed including risk with sedation, risk of bleeding, perforation and infection  -Further follow up to be determined after colonoscopy completed   2. Abdominal bloat -Probiotic of choice once daily  3. History of LLE DVT most likely due to mobility restrictions to s/p left femur surgery with residual deformity which requires the use of a brace. However, she had recurrent LLE and LUE DVT in 2016 with a negative hematology evaluation (past medical records not available).  She remains on Xarelto.  -Our office will contact Dr. Darrick Meigs to verify Xarelto instructions prior to proceeding with a colonoscopy   4. Asthma, currently stable        CC:  Mitzi Hansen, MD

## 2020-03-16 NOTE — Patient Instructions (Signed)
You have been scheduled for a colonoscopy. Please follow written instructions given to you at your visit today.  Please pick up your prep supplies at the pharmacy within the next 1-3 days. If you use inhalers (even only as needed), please bring them with you on the day of your procedure.  You will be contacted by our office prior to your procedure for directions on holding your xarelto.  If you do not hear from our office 1 week prior to your scheduled procedure, please call [phone] to discuss.    Purchase over the counter Phillip's Bacteria Probiotic , take one pill daily.   I appreciate the opportunity to care for you. Carl Best, CRNP

## 2020-03-17 DIAGNOSIS — R140 Abdominal distension (gaseous): Secondary | ICD-10-CM | POA: Insufficient documentation

## 2020-03-18 ENCOUNTER — Telehealth: Payer: Self-pay

## 2020-03-18 NOTE — Telephone Encounter (Signed)
Faxed completed application for Xarelto 10mg  patient assistance to [employer] for processing today.

## 2020-03-19 NOTE — Progress Notes (Signed)
____________________________________________________________  Attending physician addendum:  Thank you for sending this case to me. I have reviewed the entire note, and the outlined plan seems appropriate.  Brandye Inthavong Danis, MD  ____________________________________________________________  

## 2020-03-25 ENCOUNTER — Ambulatory Visit: Payer: 59 | Admitting: *Deleted

## 2020-03-25 DIAGNOSIS — I10 Essential (primary) hypertension: Secondary | ICD-10-CM

## 2020-03-25 DIAGNOSIS — M79605 Pain in left leg: Secondary | ICD-10-CM

## 2020-03-25 DIAGNOSIS — G8929 Other chronic pain: Secondary | ICD-10-CM

## 2020-03-25 DIAGNOSIS — J4520 Mild intermittent asthma, uncomplicated: Secondary | ICD-10-CM

## 2020-03-25 NOTE — Chronic Care Management (AMB) (Signed)
  Care Management   Note  03/25/2020 Name: Lynn Pennington MRN: [id number] DOB: [birth year]  Lenisha Lacap is enrolled in a Managed Medicaid plan: No. Outreach attempt today was unsuccessful.   Received voice mail from Dr Celesta Aver office medical assistant PJ Martinique requesting assistance in contacting patient's  provider to secure permission to hold oral anticoagulation in preparation for GI procedure.  Sent message to Internal Medicine Blue Team attached to 11/9 Epic note of PJ Martinique requesting attention to request to give permission to hold anticoagulant. Sent message to PJ Martinique advising her [facility] Team has been notified to assist with her request.   Kelli Churn RN, CCM, [facility] RN Care Manager [phone]

## 2020-03-25 NOTE — Telephone Encounter (Signed)
I tried to call her PCP and the phone just rings and rings. I have left a detailed voicemail for Lynn Churn, RN that I see works with that clinic as well. Hopefully we will hear back and not have to cancel Lynn Pennington's procedure.  I will also route to Dr Lynn Pennington as I see in epic he was the last one to refill it.

## 2020-03-25 NOTE — Telephone Encounter (Signed)
Called [place] and [place] today to check on status of application.  Application was confirmed received and still pending/processing per [employer] agent.  Will call back to check status in 3 business days if we haven't heard anything before then.

## 2020-03-26 NOTE — Telephone Encounter (Signed)
Left her a message on her cell # to call me back next week to discuss holding her blood thinner.

## 2020-03-26 NOTE — Progress Notes (Addendum)
Internal Medicine Clinic Resident  I have personally reviewed this encounter including the documentation in this note and/or discussed this patient with the care management provider. I will address any urgent items identified by the care management provider and will communicate my actions to the patient's PCP. I have reviewed the patient's CCM visit with my supervising attending, Dr Heber [place].  Sanjuana Letters, MD 03/26/2020

## 2020-03-26 NOTE — Telephone Encounter (Signed)
Our apologies for being difficult to contact.  We work as a [employer] and Dr. Darrick Meigs is currently on inpatient service.  I have reviewed Lynn Pennington's record and she takes Xarelto for recurrent DVTs. Her last DVT was in 2016.  To minimize her risk I recommend having her not take her Sunday, December 7th Xarelto.  Her colonoscopy is scheduled 04/15/19/2021, and this should be sufficient enough time for Xarelto to be out of her system given good kidney function.  Resuming her Xarelto will be based on the findings and interventions.  Please let me know if I can be of further assistance.

## 2020-03-26 NOTE — Telephone Encounter (Signed)
I left a message on her home # to call me back.

## 2020-03-29 NOTE — Telephone Encounter (Signed)
Left her another message to call me back. 

## 2020-03-30 ENCOUNTER — Telehealth: Payer: Self-pay

## 2020-03-30 NOTE — Telephone Encounter (Signed)
Spoke to a representative at [employer] regarding Xarelto PAP application that was submitted on 03/18/2020 today.  I called on 25/42/7062 and application was in process.  Called today and application still in process, requested that the application be expedited due to submission 03/18/20--processing has exceeded the 2-3 business day window.  [employer] rep stated that they would expedite the application processing and have a response in 24-48 hours.

## 2020-03-31 NOTE — Telephone Encounter (Signed)
I've been unable to reach [place] so I reached out to her emergency contact-mom Blanch Media. She wrote down our # and is going to tell Collie to call me next week. She is aware we are closed Thursday and Friday.

## 2020-04-05 ENCOUNTER — Ambulatory Visit (HOSPITAL_COMMUNITY): Payer: 59 | Admitting: Clinical

## 2020-04-05 NOTE — Telephone Encounter (Signed)
Patient informed and verbalized understanding to hold her xarelto December 7th.

## 2020-04-06 ENCOUNTER — Telehealth: Payer: Self-pay

## 2020-04-06 NOTE — Telephone Encounter (Signed)
Spoke to patient and filling pharmacy today.  Patient is aware of approval and pharmacy ran a successful claim through for the Xarelto 10mg , 30 day supply, for $0 and are filling the script for patient now.  Patient was approved for medication assistance with [place] [place] and [place] until 04/02/2021.   Advised patient that she would have to start the re-enrollment process around September of next year if assistance is still needed at that time.  Pharmacy card for $0 copay(not billed through patient's primary ins); BIN:  [id number] ID:  [id number] GRP: [id number]

## 2020-04-07 ENCOUNTER — Ambulatory Visit (HOSPITAL_COMMUNITY): Payer: 59 | Admitting: Physician Assistant

## 2020-04-12 ENCOUNTER — Telehealth: Payer: Self-pay | Admitting: Gastroenterology

## 2020-04-12 ENCOUNTER — Telehealth: Payer: Self-pay | Admitting: *Deleted

## 2020-04-12 ENCOUNTER — Telehealth: Payer: Self-pay | Admitting: Licensed Clinical Social Worker

## 2020-04-12 NOTE — Telephone Encounter (Signed)
Care Management   Outreach Note  04/12/2020 Name: Farhana Fellows MRN: [id number] DOB: 1970-07-06  Patient has stated to CCM BSW on multiple occassions that she wishes to have a "medical transfer" back to [name] through [name].  Patient previously asked for a letter to be provided stating that she would like to request transfer.  Letter was sent to patient and she confirmed receipt on 03/12/20.  Patient has been informed multiple times that CCM BSW cannot assist with relocation to [name] and that this should be addressed directly with housing authority.  Patient stated today "I just need a letter stating I want to transfer"  Reminded patient that letter was already sent to her at which time she stated she has not had time to do anything with it due to the holidays.   Reiterated today that BSW cannot assist with relocation.  Patient was closed to CCM services on 03/12/20.         Ronn Melena, [place] Coordination Social Worker [place] [phone]

## 2020-04-12 NOTE — Telephone Encounter (Signed)
Pt calls and states if she is getting xarelto for free everything should be free. Since everything else is not free she does not want to see any doctors or therapist that there is a copay for She would like amber to call her asap to discuss this and if amber has found a way for her to get back to [place] where everything is free. Nurse ask which doctors pt didn't want to see due to copays. She stated "just have amber call me cause im going to go off and have to go in the hospital and I cant do that"

## 2020-04-12 NOTE — Telephone Encounter (Signed)
Pt is requesting a call back from a nurse regarding her cancelled procedure, pt did not disclose any further information.

## 2020-04-13 NOTE — Telephone Encounter (Signed)
Called patient, vm full, unable to leave a voicemail.

## 2020-04-14 ENCOUNTER — Encounter: Payer: 59 | Admitting: Gastroenterology

## 2020-04-14 ENCOUNTER — Telehealth: Payer: Self-pay | Admitting: Licensed Clinical Social Worker

## 2020-04-14 DIAGNOSIS — F331 Major depressive disorder, recurrent, moderate: Secondary | ICD-10-CM

## 2020-04-14 NOTE — BH Specialist Note (Signed)
[facility] Virtual BH Follow Up Assessment  MRN: [id number] NAME: Lynn Pennington Date: 04/13/20  Start time: 415p End time: 445p Total time: 30  Type of Contact: Follow up Call  Current concerns/stressors: Patient reports that she is originally from [place] and moved to [place] to better herself.  She denies any contact with family reporting that it is a trigger for her. She list her stressors as: poor sleep, poor appetite, lack of motivation, sadness, crying spells and poor energy.  She has difficulty with sustaining employment due to symptoms.   Screens/Assessment Tools:  PHQ-9 & GAD-7 Assessments: This is an evidence based assessment tool for depression and anxiety symptoms in adolescents and adults.  Score cut-off points for each section are as follows: 5-9: Mild, 10-14: Moderate, 15+: Severe   How difficult have these problems made it for you to do your work, take care of things at home, or get along with other people? Very Difficult  Functional Assessment:  Sleep: poor Appetite: poor Coping ability: overwhelmed Patient taking medications as prescribed: Patient taking medications as prescribed: Yes  Current medications:  Outpatient Encounter Medications as of 04/14/2020  Medication Sig  . beclomethasone (QVAR) 40 MCG/ACT inhaler Inhale 1 puff into the lungs daily. Pt unsure of dosage  . hydrochlorothiazide (HYDRODIURIL) 25 MG tablet Take 25 mg by mouth every morning.  . hydrOXYzine (ATARAX/VISTARIL) 25 MG tablet Take 1 tablet (25 mg total) by mouth every 6 (six) hours.  Marland Kitchen PEG-KCl-NaCl-NaSulf-Na Asc-C (PLENVU) 140 g SOLR Take 1 kit by mouth as directed.  . rivaroxaban (XARELTO) 10 MG TABS tablet Take 1 tablet (10 mg total) by mouth daily.  . sertraline (ZOLOFT) 50 MG tablet TAKE 1 TABLET(50 MG) BY MOUTH DAILY   No facility-administered encounter medications on file as of 04/14/2020.    Self-harm and/or Suicidal Behaviors Risk Assessment Self-harm risk factors:  n/a Patient endorses recent self injurious thoughts and/or behaviors: No   Suicide ideations: No plan to harm self or others   Danger to Others Risk Assessment Danger to others risk factors: n/a Patient endorses recent thoughts of harming others: No    Substance Use Assessment Patient recently consumed alcohol: No  Patient recently used drugs: No  Patient is concerned about dependence or abuse of substances: No    Goals, Interventions and Follow-up Plan Goals: Increase healthy adjustment to current life circumstances and Increase adequate support systems for patient/family Interventions: Solution-Focused Strategies and Mindfulness or Relaxation Training   Summary of Clinical Assessment  Lynn Pennington is a 49 year old single woman that was originally referred to [facility] by her PCP office.  VBH Clinician has been unable to make contact with her.  Patient requested in-person outpatient therapy in October 2021 and was referred by Writer.  Patient reports that she continues to have difficulty with sadness, lack of motivation, crying spells.  She is unsure of her triggers.  She reports not being able to sustain employment due to symptoms.  She wants to move back to [place] but wants assistance with housing as she is currently living in [place].  Provided psychoeducation and discussed coping skills.  Encouraged Patient to attempt one different thing daily and journal how she feels daily.  Will follow up with Patient in 1 week. Writer provided Patient with [facility] telephone number to make weekly.  Writer will refer to in-person outpatient therapy.  Writer will make weekly calls until she is connected with in-person [facility] services.    Follow-up Plan: Weekly VBH sessions  Lubertha South, LCSW

## 2020-04-14 NOTE — Telephone Encounter (Signed)
Spoke with patient, she states that she rescheduled her procedure so that she could get the prep in time. Patient had no other concerns

## 2020-04-19 ENCOUNTER — Ambulatory Visit (HOSPITAL_COMMUNITY): Payer: Self-pay | Admitting: Clinical

## 2020-04-19 ENCOUNTER — Other Ambulatory Visit: Payer: Self-pay | Admitting: Gastroenterology

## 2020-04-19 LAB — SARS CORONAVIRUS 2 (TAT 6-24 HRS): SARS Coronavirus 2: NEGATIVE

## 2020-04-20 ENCOUNTER — Ambulatory Visit (AMBULATORY_SURGERY_CENTER): Payer: 59 | Admitting: Gastroenterology

## 2020-04-20 ENCOUNTER — Other Ambulatory Visit: Payer: Self-pay

## 2020-04-20 ENCOUNTER — Encounter: Payer: Self-pay | Admitting: Gastroenterology

## 2020-04-20 VITALS — BP 137/84 | HR 63 | Temp 98.9°F | Resp 22 | Ht 64.0 in | Wt 235.0 lb

## 2020-04-20 DIAGNOSIS — D123 Benign neoplasm of transverse colon: Secondary | ICD-10-CM | POA: Diagnosis not present

## 2020-04-20 DIAGNOSIS — Z1211 Encounter for screening for malignant neoplasm of colon: Secondary | ICD-10-CM | POA: Diagnosis not present

## 2020-04-20 DIAGNOSIS — D124 Benign neoplasm of descending colon: Secondary | ICD-10-CM

## 2020-04-20 MED ORDER — SODIUM CHLORIDE 0.9 % IV SOLN: 500.0000 mL | Freq: Once | INTRAVENOUS | Status: DC

## 2020-04-20 NOTE — Op Note (Signed)
[place] Patient Name: Lynn Pennington Procedure Date: 04/20/2020 2:46 PM MRN: [id number] Endoscopist: Benson. Loletha Carrow , MD Age: 49 Referring MD:  Date of Birth: 1970/06/04 Gender: Female Account #: [id number] Procedure:                Colonoscopy Indications:              Screening for colorectal malignant neoplasm, This                            is the patient's first colonoscopy Medicines:                Monitored Anesthesia Care Procedure:                Pre-Anesthesia Assessment:                           - Prior to the procedure, a History and Physical                            was performed, and patient medications and                            allergies were reviewed. The patient's tolerance of                            previous anesthesia was also reviewed. The risks                            and benefits of the procedure and the sedation                            options and risks were discussed with the patient.                            All questions were answered, and informed consent                            was obtained. Prior Anticoagulants: The patient has                            taken Xarelto (rivaroxaban), last dose was 2 days                            prior to procedure. ASA Grade Assessment: III - A                            patient with severe systemic disease. After                            reviewing the risks and benefits, the patient was                            deemed in satisfactory condition to undergo the  procedure.                           After obtaining informed consent, the colonoscope                            was passed under direct vision. Throughout the                            procedure, the patient's blood pressure, pulse, and                            oxygen saturations were monitored continuously. The                            Colonoscope was introduced through the anus and                             advanced to the the cecum, identified by                            appendiceal orifice and ileocecal valve. The                            colonoscopy was performed without difficulty. The                            patient tolerated the procedure well. The quality                            of the bowel preparation was good. The ileocecal                            valve, appendiceal orifice, and rectum were                            photographed. Scope In: 2:52:10 PM Scope Out: 3:06:12 PM Scope Withdrawal Time: 0 hours 10 minutes 20 seconds  Total Procedure Duration: 0 hours 14 minutes 2 seconds  Findings:                 The perianal and digital rectal examinations were                            normal.                           Two sessile polyps were found in the descending                            colon and transverse colon. The polyps were 4 to 6                            mm in size. These polyps were removed with a cold  snare. Resection and retrieval were complete.                           The exam was otherwise without abnormality on                            direct and retroflexion views. Complications:            No immediate complications. Estimated Blood Loss:     Estimated blood loss was minimal. Impression:               - Two 4 to 6 mm polyps in the descending colon and                            in the transverse colon, removed with a cold snare.                            Resected and retrieved.                           - The examination was otherwise normal on direct                            and retroflexion views. Recommendation:           - Patient has a contact number available for                            emergencies. The signs and symptoms of potential                            delayed complications were discussed with the                            patient. Return to normal activities tomorrow.                             Written discharge instructions were provided to the                            patient.                           - Resume previous diet.                           - Resume Xarelto (rivaroxaban) at prior dose today.                           - Await pathology results.                           - Repeat colonoscopy is recommended for                            surveillance. The colonoscopy date will be  determined after pathology results from today's                            exam become available for review. Allen Egerton L. Loletha Carrow, MD 04/20/2020 3:10:14 PM This report has been signed electronically.

## 2020-04-20 NOTE — Patient Instructions (Signed)
Discharge instructions given. Handout on polyps. Resume previous medications. Resume Xarelto at prior dose today. YOU HAD AN ENDOSCOPIC PROCEDURE TODAY AT [facility]:   Refer to the procedure report that was given to you for any specific questions about what was found during the examination.  If the procedure report does not answer your questions, please call your gastroenterologist to clarify.  If you requested that your care partner not be given the details of your procedure findings, then the procedure report has been included in a sealed envelope for you to review at your convenience later.  YOU SHOULD EXPECT: Some feelings of bloating in the abdomen. Passage of more gas than usual.  Walking can help get rid of the air that was put into your GI tract during the procedure and reduce the bloating. If you had a lower endoscopy (such as a colonoscopy or flexible sigmoidoscopy) you may notice spotting of blood in your stool or on the toilet paper. If you underwent a bowel prep for your procedure, you may not have a normal bowel movement for a few days.  Please Note:  You might notice some irritation and congestion in your nose or some drainage.  This is from the oxygen used during your procedure.  There is no need for concern and it should clear up in a day or so.  SYMPTOMS TO REPORT IMMEDIATELY:   Following lower endoscopy (colonoscopy or flexible sigmoidoscopy):  Excessive amounts of blood in the stool  Significant tenderness or worsening of abdominal pains  Swelling of the abdomen that is new, acute  Fever of 100F or higher   For urgent or emergent issues, a gastroenterologist can be reached at any hour by calling [phone]. Do not use MyChart messaging for urgent concerns.    DIET:  We do recommend a small meal at first, but then you may proceed to your regular diet.  Drink plenty of fluids but you should avoid alcoholic beverages for 24 hours.  ACTIVITY:  You  should plan to take it easy for the rest of today and you should NOT DRIVE or use heavy machinery until tomorrow (because of the sedation medicines used during the test).    FOLLOW UP: Our staff will call the number listed on your records 48-72 hours following your procedure to check on you and address any questions or concerns that you may have regarding the information given to you following your procedure. If we do not reach you, we will leave a message.  We will attempt to reach you two times.  During this call, we will ask if you have developed any symptoms of COVID 19. If you develop any symptoms (ie: fever, flu-like symptoms, shortness of breath, cough etc.) before then, please call [phone].  If you test positive for Covid 19 in the 2 weeks post procedure, please call and report this information to [place].    If any biopsies were taken you will be contacted by phone or by letter within the next 1-3 weeks.  Please call us at [phone] if you have not heard about the biopsies in 3 weeks.    SIGNATURES/CONFIDENTIALITY: You and/or your care partner have signed paperwork which will be entered into your electronic medical record.  These signatures attest to the fact that that the information above on your After Visit Summary has been reviewed and is understood.  Full responsibility of the confidentiality of this discharge information lies with you and/or your care-partner.

## 2020-04-20 NOTE — Progress Notes (Signed)
Pt's states no medical or surgical changes since previsit or office visit.  CW - vitals 

## 2020-04-20 NOTE — Progress Notes (Signed)
PT taken to PACU. Monitors in place. VSS. Report given to RN. 

## 2020-04-20 NOTE — Progress Notes (Signed)
Called to room to assist during endoscopic procedure.  Patient ID and intended procedure confirmed with present staff. Received instructions for my participation in the procedure from the performing physician.  

## 2020-04-22 ENCOUNTER — Telehealth: Payer: Self-pay | Admitting: *Deleted

## 2020-04-22 NOTE — Telephone Encounter (Signed)
  Follow up Call-  Call back number 04/20/2020  Post procedure Call Back phone  # [phone]  Permission to leave phone message Yes     Patient questions:  Do you have a fever, pain , or abdominal swelling? No. Pain Score  0 *  Have you tolerated food without any problems? Yes.    Have you been able to return to your normal activities? Yes.    Do you have any questions about your discharge instructions: Diet   No. Medications  No. Follow up visit  No.  Do you have questions or concerns about your Care? No.  Actions: * If pain score is 4 or above: No action needed, pain <4.  1. Have you developed a fever since your procedure? no  2.   Have you had an respiratory symptoms (SOB or cough) since your procedure? no  3.   Have you tested positive for COVID 19 since your procedure no  4.   Have you had any family members/close contacts diagnosed with the COVID 19 since your procedure?  no   If yes to any of these questions please route to Joylene John, RN and Joella Prince, RN

## 2020-04-27 ENCOUNTER — Encounter: Payer: Self-pay | Admitting: Gastroenterology

## 2020-05-13 ENCOUNTER — Other Ambulatory Visit: Payer: Self-pay

## 2020-05-13 ENCOUNTER — Encounter (HOSPITAL_COMMUNITY): Payer: Self-pay

## 2020-05-13 ENCOUNTER — Ambulatory Visit (INDEPENDENT_AMBULATORY_CARE_PROVIDER_SITE_OTHER): Payer: HRSA Program

## 2020-05-13 ENCOUNTER — Ambulatory Visit (HOSPITAL_COMMUNITY)
Admission: EM | Admit: 2020-05-13 | Discharge: 2020-05-13 | Disposition: A | Discharge status: Home / Self Care | Payer: HRSA Program | Attending: Family Medicine | Admitting: Family Medicine

## 2020-05-13 ENCOUNTER — Telehealth: Payer: Self-pay | Admitting: *Deleted

## 2020-05-13 DIAGNOSIS — Z20822 Contact with and (suspected) exposure to covid-19: Secondary | ICD-10-CM | POA: Insufficient documentation

## 2020-05-13 DIAGNOSIS — R059 Cough, unspecified: Secondary | ICD-10-CM | POA: Diagnosis present

## 2020-05-13 DIAGNOSIS — F17210 Nicotine dependence, cigarettes, uncomplicated: Secondary | ICD-10-CM | POA: Insufficient documentation

## 2020-05-13 DIAGNOSIS — J189 Pneumonia, unspecified organism: Secondary | ICD-10-CM | POA: Diagnosis not present

## 2020-05-13 DIAGNOSIS — R062 Wheezing: Secondary | ICD-10-CM

## 2020-05-13 DIAGNOSIS — J45909 Unspecified asthma, uncomplicated: Secondary | ICD-10-CM | POA: Insufficient documentation

## 2020-05-13 DIAGNOSIS — R0602 Shortness of breath: Secondary | ICD-10-CM

## 2020-05-13 MED ORDER — PREDNISONE 20 MG PO TABS: 40.0000 mg | ORAL_TABLET | Freq: Every day | ORAL | 0 refills | Status: DC

## 2020-05-13 MED ORDER — ALBUTEROL SULFATE HFA 108 (90 BASE) MCG/ACT IN AERS: 1.0000 | INHALATION_SPRAY | Freq: Four times a day (QID) | RESPIRATORY_TRACT | 1 refills | Status: DC | PRN

## 2020-05-13 NOTE — ED Triage Notes (Signed)
Pt c/o chest discomfort, runny nose, nasal congestion X 5 days. Pt states the sxs have gotten worse throughout the days. Pt states she has asthma and the sxs have caused a flare up. Pt states she has gotten dizzy. Pt states she has been experiencing wheezing.

## 2020-05-13 NOTE — Discharge Instructions (Addendum)
You have been tested for COVID-19 today. °If your test returns positive, you will receive a phone call from [facility] regarding your results. °Negative test results are not called. °Both positive and negative results area always visible on MyChart. °If you do not have a MyChart account, sign up instructions are provided in your discharge papers. °Please do not hesitate to contact us should you have questions or concerns. ° °

## 2020-05-13 NOTE — ED Provider Notes (Signed)
[facility]   [id number] 05/13/20 Arrival Time: 1252  ASSESSMENT & PLAN:  1. SOB (shortness of breath)   2. Wheezing   3. Multifocal pneumonia     I have personally viewed the imaging studies ordered this visit. Multifocal PNA.  No fevers. VSS. Suspicious for viral etiology. Discussed.  COVID-19 testing sent. See letter/work note on file for self-isolation guidelines. OTC symptom care as needed.  Meds ordered this encounter  Medications   predniSONE (DELTASONE) 20 MG tablet    Sig: Take 2 tablets (40 mg total) by mouth daily.    Dispense:  10 tablet    Refill:  0   albuterol (VENTOLIN HFA) 108 (90 Base) MCG/ACT inhaler    Sig: Inhale 1-2 puffs into the lungs every 6 (six) hours as needed for wheezing or shortness of breath.    Dispense:  1 each    Refill:  1     Follow-up Information    [facility] EMERGENCY DEPARTMENT.   Specialty: Emergency Medicine Why: If symptoms worsen in any way. Contact information: [address] [id number] mc [place] [address] [phone]              Reviewed expectations re: course of current medical issues. Questions answered. Outlined signs and symptoms indicating need for more acute intervention. Understanding verbalized. After Visit Summary given.   SUBJECTIVE: History from: patient. Lynn Pennington is a 50 y.o. female who reports nasal congestion, runny nos, coughing over past 4-5 days. Fatigued. Questions wheezing/asthma exac. No spec SOB reported. Denies: fever. Normal PO intake without n/v/d.    OBJECTIVE:  Vitals:   05/13/20 1416  BP: (!) 139/92  Pulse: (!) 53  Resp: 15  Temp: 98.5 F (36.9 C)  TempSrc: Oral  SpO2: 100%    General appearance: alert; no distress Eyes: PERRLA; EOMI; conjunctiva normal HENT: [place]; AT; with nasal congestion Neck: supple  Lungs: speaks full sentences without difficulty; unlabored; mild bilat exp wheezes present Extremities: no  edema Skin: warm and dry Neurologic: normal gait Psychological: alert and cooperative; normal mood and affect  Labs:  Labs Reviewed  SARS CORONAVIRUS 2 (TAT 6-24 HRS)    Imaging: DG Chest 2 View  Result Date: 05/13/2020 CLINICAL DATA:  Shortness of breath Chest discomfort Runny nose delighted nasal congestion for 5 days History of asthma EXAM: CHEST - 2 VIEW COMPARISON:  01/04/2019 FINDINGS: Cardiomediastinal silhouette and pulmonary vasculature are within normal limits. Increased hazy opacities in the lower lungs bilaterally suspicious for pneumonia. IMPRESSION: Findings suspicious for multifocal pneumonia. Electronically Signed   By: Acquanetta Belling M.D.   On: 05/13/2020 15:01    No Known Allergies  Past Medical History:  Diagnosis Date   Anxiety    Asthma    Chronic leg pain    Depression    DVT (deep venous thrombosis) (HCC) 2010, 2016   Hypertension    Iron deficiency    Social History   Socioeconomic History   Marital status: Single    Spouse name: Not on file   Number of children: Not on file   Years of education: Not on file   Highest education level: Not on file  Occupational History   Not on file  Tobacco Use   Smoking status: Current Every Day Smoker    Packs/day: 0.50    Types: Cigarettes   Smokeless tobacco: Never Used  Vaping Use   Vaping Use: Never used  Substance and Sexual Activity   Alcohol use: Not Currently  Drug use: Not Currently   Sexual activity: Yes  Other Topics Concern   Not on file  Social History Narrative   Not on file   Social Determinants of Health   Financial Resource Strain: High Risk   Difficulty of Paying Living Expenses: Very hard  Food Insecurity: No Food Insecurity   Worried About Running Out of Food in the Last Year: Never true   Ran Out of Food in the Last Year: Never true  Transportation Needs: No Transportation Needs   Lack of Transportation (Medical): No   Lack of Transportation  (Non-Medical): No  Physical Activity: Not on file  Stress: Not on file  Social Connections: Not on file  Intimate Partner Violence: Not on file   Family History  Problem Relation Age of Onset   Colon cancer Neg Hx    Esophageal cancer Neg Hx    Rectal cancer Neg Hx    Stomach cancer Neg Hx    Past Surgical History:  Procedure Laterality Date   ABDOMINAL HYSTERECTOMY     FRACTURE SURGERY       Vanessa Kick, MD 05/18/20 [phone]

## 2020-05-13 NOTE — Telephone Encounter (Signed)
  Care Management   Outreach Note  05/13/2020 Name: Lynn Pennington MRN: [id number] DOB: [birth year]  Referred by: Elige Radon, MD Reason for referral : Care Coordination (Asthma, HTN, hx of DVT, anxiety/depression, chronic leg pain)   Lynn Pennington is enrolled in a Managed Medicaid Health Plan: No  An unsuccessful telephone outreach was attempted today. Patient called clinic, requesting to speak with socail worker. Patient's call was forwarded to this this CCM RN voice mail. Patient left requesting a return call and stating she is presently at [facility] and is confused because the staff there told her she does not have insurance. Not able to leave message as recording states voice mail box is full. The patient was referred to the case management team for assistance with care management and care coordination.   Follow Up Plan: Await return call from patient.  Cranford Mon RN, CCM, CDCES CCM Clinic RN Care Manager [phone]

## 2020-05-14 LAB — SARS CORONAVIRUS 2 (TAT 6-24 HRS): SARS Coronavirus 2: NEGATIVE

## 2020-05-21 ENCOUNTER — Other Ambulatory Visit: Payer: Self-pay

## 2020-05-21 ENCOUNTER — Emergency Department (HOSPITAL_COMMUNITY)
Admission: EM | Admit: 2020-05-21 | Discharge: 2020-05-21 | Disposition: A | Discharge status: Home / Self Care | Payer: 59 | Attending: Emergency Medicine | Admitting: Emergency Medicine

## 2020-05-21 ENCOUNTER — Emergency Department (HOSPITAL_COMMUNITY): Payer: 59

## 2020-05-21 ENCOUNTER — Encounter (HOSPITAL_COMMUNITY): Payer: Self-pay

## 2020-05-21 ENCOUNTER — Telehealth: Payer: Self-pay | Admitting: *Deleted

## 2020-05-21 DIAGNOSIS — F17210 Nicotine dependence, cigarettes, uncomplicated: Secondary | ICD-10-CM | POA: Diagnosis not present

## 2020-05-21 DIAGNOSIS — Z79899 Other long term (current) drug therapy: Secondary | ICD-10-CM | POA: Diagnosis not present

## 2020-05-21 DIAGNOSIS — R059 Cough, unspecified: Secondary | ICD-10-CM | POA: Diagnosis not present

## 2020-05-21 DIAGNOSIS — J45909 Unspecified asthma, uncomplicated: Secondary | ICD-10-CM | POA: Insufficient documentation

## 2020-05-21 DIAGNOSIS — R0600 Dyspnea, unspecified: Secondary | ICD-10-CM | POA: Diagnosis not present

## 2020-05-21 DIAGNOSIS — R0789 Other chest pain: Secondary | ICD-10-CM | POA: Insufficient documentation

## 2020-05-21 DIAGNOSIS — Z20822 Contact with and (suspected) exposure to covid-19: Secondary | ICD-10-CM | POA: Diagnosis not present

## 2020-05-21 DIAGNOSIS — Z86718 Personal history of other venous thrombosis and embolism: Secondary | ICD-10-CM | POA: Diagnosis not present

## 2020-05-21 DIAGNOSIS — I10 Essential (primary) hypertension: Secondary | ICD-10-CM | POA: Insufficient documentation

## 2020-05-21 DIAGNOSIS — R0602 Shortness of breath: Secondary | ICD-10-CM | POA: Diagnosis present

## 2020-05-21 LAB — CBC WITH DIFFERENTIAL/PLATELET
Abs Immature Granulocytes: 0.02 10*3/uL (ref 0.00–0.07)
Basophils Absolute: 0 10*3/uL (ref 0.0–0.1)
Basophils Relative: 0 %
Eosinophils Absolute: 0.2 10*3/uL (ref 0.0–0.5)
Eosinophils Relative: 2 %
HCT: 39.5 % (ref 36.0–46.0)
Hemoglobin: 12.8 g/dL (ref 12.0–15.0)
Immature Granulocytes: 0 %
Lymphocytes Relative: 48 %
Lymphs Abs: 3.9 10*3/uL (ref 0.7–4.0)
MCH: 32.1 pg (ref 26.0–34.0)
MCHC: 32.4 g/dL (ref 30.0–36.0)
MCV: 99 fL (ref 80.0–100.0)
Monocytes Absolute: 0.5 10*3/uL (ref 0.1–1.0)
Monocytes Relative: 6 %
Neutro Abs: 3.7 10*3/uL (ref 1.7–7.7)
Neutrophils Relative %: 44 %
Platelets: 214 10*3/uL (ref 150–400)
RBC: 3.99 MIL/uL (ref 3.87–5.11)
RDW: 13.3 % (ref 11.5–15.5)
WBC: 8.3 10*3/uL (ref 4.0–10.5)
nRBC: 0 % (ref 0.0–0.2)

## 2020-05-21 LAB — RESP PANEL BY RT-PCR (FLU A&B, COVID) ARPGX2
Influenza A by PCR: NEGATIVE
Influenza B by PCR: NEGATIVE
SARS Coronavirus 2 by RT PCR: NEGATIVE

## 2020-05-21 LAB — COMPREHENSIVE METABOLIC PANEL
ALT: 17 U/L (ref 0–44)
AST: 16 U/L (ref 15–41)
Albumin: 4 g/dL (ref 3.5–5.0)
Alkaline Phosphatase: 46 U/L (ref 38–126)
Anion gap: 13 (ref 5–15)
BUN: 12 mg/dL (ref 6–20)
CO2: 24 mmol/L (ref 22–32)
Calcium: 9.4 mg/dL (ref 8.9–10.3)
Chloride: 104 mmol/L (ref 98–111)
Creatinine, Ser: 0.78 mg/dL (ref 0.44–1.00)
GFR, Estimated: >60 mL/min (ref >60)
Glucose, Bld: 110 mg/dL — ABNORMAL HIGH (ref 70–99)
Potassium: 3.5 mmol/L (ref 3.5–5.1)
Sodium: 141 mmol/L (ref 135–145)
Total Bilirubin: 0.3 mg/dL (ref 0.3–1.2)
Total Protein: 7 g/dL (ref 6.5–8.1)

## 2020-05-21 LAB — BRAIN NATRIURETIC PEPTIDE: B Natriuretic Peptide: 75.8 pg/mL (ref 0.0–100.0)

## 2020-05-21 LAB — PROTIME-INR
INR: 1 (ref 0.8–1.2)
Prothrombin Time: 12.4 seconds (ref 11.4–15.2)

## 2020-05-21 LAB — LACTIC ACID, PLASMA: Lactic Acid, Venous: 1 mmol/L (ref 0.5–1.9)

## 2020-05-21 LAB — TROPONIN I (HIGH SENSITIVITY): Troponin I (High Sensitivity): 10 ng/L (ref <18)

## 2020-05-21 MED ORDER — IOHEXOL 350 MG/ML SOLN
100.0000 mL | Freq: Once | INTRAVENOUS | Status: AC | PRN
  Administered 2020-05-21: 100 mL via INTRAVENOUS

## 2020-05-21 MED ORDER — ALBUTEROL SULFATE HFA 108 (90 BASE) MCG/ACT IN AERS: 1.0000 | INHALATION_SPRAY | Freq: Four times a day (QID) | RESPIRATORY_TRACT | 1 refills | Status: DC | PRN

## 2020-05-21 MED ORDER — PREDNISONE 10 MG PO TABS: 20.0000 mg | ORAL_TABLET | Freq: Every day | ORAL | 0 refills | Status: DC

## 2020-05-21 NOTE — Telephone Encounter (Signed)
Patient called in requesting appt for "chest complications" and was transferred to Triage RN. States she went to UC on 05/13/2020 and was told her lungs looked like post-covid. Her covid test came back negative on 05/13/2020 and 04/19/2020. She is c/o SHOB x 3 weeks with intermittent, non-radiating, left-sided chest discomfort that lasts 10-15 minutes and happens about once per day. Also c/o "palpitations" that proceeds chest pain. Denies, fever, chills, cough. Received one covid vaccine last month. Also with hx of asthma. Discussed with Dr. Marianna Payment, and with Dr. Marianna Payment present, advised patient to head directly to ED. States she will pick up her grandchild and head to ED. Hubbard Hartshorn, BSN, RN-BC

## 2020-05-21 NOTE — ED Triage Notes (Signed)
Pt reports chest discomfort and SHOB since last week. Pt reports having a chest x-ray last week and was told it was abnormal.

## 2020-05-21 NOTE — Discharge Instructions (Signed)
Please return for any problem.  °

## 2020-05-21 NOTE — ED Provider Notes (Signed)
[facility] DEPT Provider Note   CSN: [id number] Arrival date & time: 05/21/20  1729     History Chief Complaint  Patient presents with  . Asthma  . Shortness of Breath         Lynn Pennington is a 50 y.o. female.  50 year old female with prior medical history as detailed below presents for evaluation.  Patient reports persistent chest pressure and mild cough for the last month.  She reports multiple prior negative COVID test.  She denies associated fever, abdominal pain, active chest pain, or other complaint.   The history is provided by the patient and medical records.  Shortness of Breath Severity:  Mild Onset quality:  Gradual Duration:  4 weeks Timing:  Constant Progression:  Worsening Chronicity:  New Relieved by:  Nothing Worsened by:  Nothing Ineffective treatments:  None tried Associated symptoms: no chest pain        Past Medical History:  Diagnosis Date  . Anxiety   . Asthma   . Chronic leg pain   . Depression   . DVT (deep venous thrombosis) ([place]) 2010, 2016  . Hypertension   . Iron deficiency     Patient Active Problem List   Diagnosis Date Noted  . Abdominal bloating 03/17/2020  . Syncope and Fatigue 02/18/2020  . Healthcare maintenance 02/04/2020  . History of recurrent deep vein thrombosis (DVT) 11/13/2019  . Depression 11/13/2019  . Asthma 11/13/2019  . Chronic leg pain 11/13/2019  . Hypertension 11/13/2019  . Anxiety     Past Surgical History:  Procedure Laterality Date  . ABDOMINAL HYSTERECTOMY    . FRACTURE SURGERY       OB History   No obstetric history on file.     Family History  Problem Relation Age of Onset  . Colon cancer Neg Hx   . Esophageal cancer Neg Hx   . Rectal cancer Neg Hx   . Stomach cancer Neg Hx     Social History   Tobacco Use  . Smoking status: Current Every Day Smoker    Packs/day: 0.50    Types: Cigarettes  . Smokeless tobacco: Never Used  Vaping Use  .  Vaping Use: Never used  Substance Use Topics  . Alcohol use: Not Currently  . Drug use: Not Currently    Home Medications Prior to Admission medications   Medication Sig Start Date End Date Taking? Authorizing Provider  albuterol (VENTOLIN HFA) 108 (90 Base) MCG/ACT inhaler Inhale 1-2 puffs into the lungs every 6 (six) hours as needed for wheezing or shortness of breath. 05/21/20  Yes Valarie Merino, MD  predniSONE (DELTASONE) 10 MG tablet Take 2 tablets (20 mg total) by mouth daily. 05/21/20  Yes Valarie Merino, MD  albuterol (VENTOLIN HFA) 108 (90 Base) MCG/ACT inhaler Inhale 1-2 puffs into the lungs every 6 (six) hours as needed for wheezing or shortness of breath. 05/13/20   Vanessa Kick, MD  beclomethasone (QVAR) 40 MCG/ACT inhaler Inhale 1 puff into the lungs daily. Pt unsure of dosage 02/04/20   Seawell, Jaimie A, DO  hydrochlorothiazide (HYDRODIURIL) 25 MG tablet Take 25 mg by mouth every morning. 11/25/18   [provider]  hydrOXYzine (ATARAX/VISTARIL) 25 MG tablet Take 1 tablet (25 mg total) by mouth every 6 (six) hours. Patient not taking: Reported on 04/20/2020 03/25/19   McDonald, Mia A, PA-C  predniSONE (DELTASONE) 20 MG tablet Take 2 tablets (40 mg total) by mouth daily. 05/13/20   Vanessa Kick, MD  rivaroxaban (XARELTO) 10 MG TABS tablet Take 1 tablet (10 mg total) by mouth daily. 03/09/20   Madalyn Rob, MD  sertraline (ZOLOFT) 50 MG tablet TAKE 1 TABLET(50 MG) BY MOUTH DAILY Patient not taking: Reported on 04/20/2020 01/16/20   Maudie Mercury, MD    Allergies    Patient has no known allergies.  Review of Systems   Review of Systems  Respiratory: Positive for shortness of breath.   Cardiovascular: Negative for chest pain.  All other systems reviewed and are negative.   Physical Exam Updated Vital Signs BP (!) 159/122 (BP Location: Right Arm)   Pulse 68   Temp 97.9 F (36.6 C) (Oral)   Resp 18   SpO2 98%   Physical Exam Vitals and nursing note  reviewed.  Constitutional:      General: She is not in acute distress.    Appearance: She is well-developed and well-nourished.  HENT:     Head: Normocephalic and atraumatic.     Mouth/Throat:     Mouth: Oropharynx is clear and moist.  Eyes:     Extraocular Movements: EOM normal.     Conjunctiva/sclera: Conjunctivae normal.     Pupils: Pupils are equal, round, and reactive to light.  Cardiovascular:     Rate and Rhythm: Normal rate and regular rhythm.     Heart sounds: Normal heart sounds.  Pulmonary:     Effort: Pulmonary effort is normal. No respiratory distress.     Breath sounds: Normal breath sounds.  Abdominal:     General: There is no distension.     Palpations: Abdomen is soft.     Tenderness: There is no abdominal tenderness.  Musculoskeletal:        General: No deformity or edema. Normal range of motion.     Cervical back: Normal range of motion and neck supple.  Skin:    General: Skin is warm and dry.  Neurological:     General: No focal deficit present.     Mental Status: She is alert. She is disoriented.     Cranial Nerves: Cranial nerve deficit present.     Motor: No weakness.  Psychiatric:        Mood and Affect: Mood and affect normal.     ED Results / Procedures / Treatments   Labs (all labs ordered are listed, but only abnormal results are displayed) Labs Reviewed  COMPREHENSIVE METABOLIC PANEL - Abnormal; Notable for the following components:      Result Value   Glucose, Bld 110 (*)    All other components within normal limits  RESP PANEL BY RT-PCR (FLU A&B, COVID) ARPGX2  CULTURE, BLOOD (ROUTINE X 2)  CULTURE, BLOOD (ROUTINE X 2)  CBC WITH DIFFERENTIAL/PLATELET  PROTIME-INR  LACTIC ACID, PLASMA  BRAIN NATRIURETIC PEPTIDE  TROPONIN I (HIGH SENSITIVITY)    EKG EKG Interpretation  Date/Time:  Friday May 21 2020 18:29:09 EST Ventricular Rate:  60 PR Interval:    QRS Duration: 109 QT Interval:  417 QTC Calculation: 417 R  Axis:   37 Text Interpretation: Sinus rhythm Confirmed by Dene Gentry [phone]) on 05/21/2020 6:36:08 PM   Radiology CT Angio Chest PE W and/or Wo Contrast  Result Date: 05/21/2020 CLINICAL DATA:  Chest discomfort and short of breath EXAM: CT ANGIOGRAPHY CHEST WITH CONTRAST TECHNIQUE: Multidetector CT imaging of the chest was performed using the standard protocol during bolus administration of intravenous contrast. Multiplanar CT image reconstructions and MIPs were obtained to evaluate the vascular anatomy. CONTRAST:  130mL OMNIPAQUE IOHEXOL 350 MG/ML SOLN COMPARISON:  Chest x-ray 05/21/2020 FINDINGS: Cardiovascular: Satisfactory opacification of the pulmonary arteries to the segmental level. No evidence of pulmonary embolism. Cardiomegaly. No pericardial effusion. Nonaneurysmal aorta. Mediastinum/Nodes: No enlarged mediastinal, hilar, or axillary lymph nodes. Thyroid gland, trachea, and esophagus demonstrate no significant findings. Lungs/Pleura: Lungs are clear. No pleural effusion or pneumothorax. Upper Abdomen: No acute abnormality. Musculoskeletal: No chest wall abnormality. No acute or significant osseous findings. Review of the MIP images confirms the above findings. IMPRESSION: 1. Negative for acute pulmonary embolus. Clear lung fields. 2. Cardiomegaly. Electronically Signed   By: Donavan Foil M.D.   On: 05/21/2020 20:52   DG Chest Port 1 View  Result Date: 05/21/2020 CLINICAL DATA:  Chest discomfort and shortness of breath for approximately 1 week. EXAM: PORTABLE CHEST 1 VIEW COMPARISON:  PA and lateral chest 05/13/2020. FINDINGS: There is cardiomegaly. Lungs are clear. Heart size is normal. No pneumothorax or pleural effusion. No acute or focal bony abnormality. IMPRESSION: No acute disease. Cardiomegaly. Electronically Signed   By: Inge Rise M.D.   On: 05/21/2020 18:58    Procedures Procedures (including critical care time)  Medications Ordered in ED Medications  iohexol  (OMNIPAQUE) 350 MG/ML injection 100 mL (100 mLs Intravenous Contrast Given 05/21/20 2031)    ED Course  I have reviewed the triage vital signs and the nursing notes.  Pertinent labs & imaging results that were available during my care of the patient were reviewed by me and considered in my medical decision making (see chart for details).    MDM Rules/Calculators/A&P                          MDM  Screen complete  Lynn Pennington was evaluated in Emergency Department on 05/21/2020 for the symptoms described in the history of present illness. She was evaluated in the context of the global COVID-19 pandemic, which necessitated consideration that the patient might be at risk for infection with the SARS-CoV-2 virus that causes COVID-19. Institutional protocols and algorithms that pertain to the evaluation of patients at risk for COVID-19 are in a state of rapid change based on information released by regulatory bodies including the CDC and federal and state organizations. These policies and algorithms were followed during the patient's care in the ED.  Patient is presenting for evaluation of reported shortness of breath.  Patient's symptoms have been ongoing for the last month.  Multiple COVID test have been negative.  Work-up today is without significant abnormality.  No evidence of ACS or significant cardiac pathology.  No evidence of PE on CTA.  COVID test is negative.  No evidence of pneumonia found.  Patient desires DC home.  She understands need for close follow-up.  Strict return precautions given understood.   Final Clinical Impression(s) / ED Diagnoses Final diagnoses:  Dyspnea, unspecified type    Rx / DC Orders ED Discharge Orders         Ordered    albuterol (VENTOLIN HFA) 108 (90 Base) MCG/ACT inhaler  Every 6 hours PRN        05/21/20 2133    predniSONE (DELTASONE) 10 MG tablet  Daily        05/21/20 2133           Valarie Merino, MD 05/21/20 2307

## 2020-05-25 NOTE — Telephone Encounter (Signed)
I agree

## 2020-05-26 LAB — CULTURE, BLOOD (ROUTINE X 2)
Culture: NO GROWTH
Special Requests: ADEQUATE

## 2020-06-06 ENCOUNTER — Other Ambulatory Visit: Payer: Self-pay

## 2020-06-06 ENCOUNTER — Encounter (HOSPITAL_COMMUNITY): Payer: Self-pay | Admitting: Emergency Medicine

## 2020-06-06 ENCOUNTER — Emergency Department (HOSPITAL_COMMUNITY): Payer: 59

## 2020-06-06 ENCOUNTER — Emergency Department (HOSPITAL_COMMUNITY)
Admission: EM | Admit: 2020-06-06 | Discharge: 2020-06-06 | Disposition: A | Discharge status: Home / Self Care | Payer: 59 | Attending: Emergency Medicine | Admitting: Emergency Medicine

## 2020-06-06 DIAGNOSIS — M545 Low back pain, unspecified: Secondary | ICD-10-CM | POA: Diagnosis not present

## 2020-06-06 DIAGNOSIS — R0789 Other chest pain: Secondary | ICD-10-CM | POA: Diagnosis not present

## 2020-06-06 DIAGNOSIS — Z7901 Long term (current) use of anticoagulants: Secondary | ICD-10-CM | POA: Insufficient documentation

## 2020-06-06 DIAGNOSIS — I82409 Acute embolism and thrombosis of unspecified deep veins of unspecified lower extremity: Secondary | ICD-10-CM | POA: Insufficient documentation

## 2020-06-06 DIAGNOSIS — Z7951 Long term (current) use of inhaled steroids: Secondary | ICD-10-CM | POA: Insufficient documentation

## 2020-06-06 DIAGNOSIS — J45909 Unspecified asthma, uncomplicated: Secondary | ICD-10-CM | POA: Diagnosis not present

## 2020-06-06 DIAGNOSIS — I10 Essential (primary) hypertension: Secondary | ICD-10-CM | POA: Insufficient documentation

## 2020-06-06 DIAGNOSIS — Y92410 Unspecified street and highway as the place of occurrence of the external cause: Secondary | ICD-10-CM | POA: Insufficient documentation

## 2020-06-06 DIAGNOSIS — F17210 Nicotine dependence, cigarettes, uncomplicated: Secondary | ICD-10-CM | POA: Insufficient documentation

## 2020-06-06 DIAGNOSIS — M25562 Pain in left knee: Secondary | ICD-10-CM | POA: Insufficient documentation

## 2020-06-06 DIAGNOSIS — Z79899 Other long term (current) drug therapy: Secondary | ICD-10-CM | POA: Insufficient documentation

## 2020-06-06 MED ORDER — ACETAMINOPHEN 500 MG PO TABS
1000.0000 mg | ORAL_TABLET | Freq: Once | ORAL | Status: DC
  Filled 2020-06-06: qty 2

## 2020-06-06 MED ORDER — METHOCARBAMOL 500 MG PO TABS: 500.0000 mg | ORAL_TABLET | Freq: Three times a day (TID) | ORAL | 0 refills | Status: DC | PRN
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
## 2020-06-06 NOTE — ED Notes (Signed)
Patient called out for pain medication x2, PA aware.

## 2020-06-24 ENCOUNTER — Ambulatory Visit (INDEPENDENT_AMBULATORY_CARE_PROVIDER_SITE_OTHER): Payer: 59 | Admitting: Internal Medicine

## 2020-06-24 ENCOUNTER — Telehealth: Payer: Self-pay | Admitting: *Deleted

## 2020-06-24 ENCOUNTER — Ambulatory Visit: Payer: 59 | Admitting: Behavioral Health

## 2020-06-24 ENCOUNTER — Other Ambulatory Visit: Payer: Self-pay

## 2020-06-24 ENCOUNTER — Encounter: Payer: Self-pay | Admitting: Internal Medicine

## 2020-06-24 VITALS — BP 145/78 | HR 65 | Temp 98.9°F | Ht 64.0 in | Wt 232.8 lb

## 2020-06-24 DIAGNOSIS — F332 Major depressive disorder, recurrent severe without psychotic features: Secondary | ICD-10-CM

## 2020-06-24 DIAGNOSIS — M79604 Pain in right leg: Secondary | ICD-10-CM | POA: Diagnosis not present

## 2020-06-24 DIAGNOSIS — Z62810 Personal history of physical and sexual abuse in childhood: Secondary | ICD-10-CM

## 2020-06-24 DIAGNOSIS — M79605 Pain in left leg: Secondary | ICD-10-CM

## 2020-06-24 DIAGNOSIS — F331 Major depressive disorder, recurrent, moderate: Secondary | ICD-10-CM

## 2020-06-24 DIAGNOSIS — G8929 Other chronic pain: Secondary | ICD-10-CM

## 2020-06-24 DIAGNOSIS — F4312 Post-traumatic stress disorder, chronic: Secondary | ICD-10-CM

## 2020-06-24 MED ORDER — SERTRALINE HCL 25 MG PO TABS: 25.0000 mg | ORAL_TABLET | Freq: Every day | ORAL | 3 refills | Status: DC

## 2020-06-24 NOTE — Patient Instructions (Addendum)
Dear Lynn Pennington,  Thank you for allowing [place] to provide your care today. Today we discussed your mood and leg pain    I have ordered no labs for you. I will call if any are abnormal.    Today we made the following changes to your medications:    Please start sertraline 25mg  daily  Please follow-up in 4 weeks   Please call the internal medicine center clinic if you have any questions or concerns, we may be able to help and keep you from a long and expensive emergency room wait. Our clinic and after hours phone number is [phone], the best time to call is Monday through Friday 9 am to 4 pm but there is always someone available 24/7 if you have an emergency. If you need medication refills please notify your pharmacy one week in advance and they will send [place] a request.    If you have not gotten the COVID vaccine, I recommend doing so:  You may get it at your local [facility] or [facility] OR To schedule an appointment for a COVID vaccine or be added to the vaccine wait list: Go to [email or web address]   OR Go to [email or web address]                  OR Call [phone]                                     OR Call [phone] and select Option 2  Thank you for choosing [facility]  Sertraline Tablets What is this medicine? SERTRALINE (SER tra leen) is used to treat depression. It may also be used to treat obsessive compulsive disorder, panic disorder, post-trauma stress disorder, premenstrual dysphoric disorder (PMDD) or social anxiety. This medicine may be used for other purposes; ask your health care provider or pharmacist if you have questions. COMMON BRAND NAME(S): Zoloft What should I tell my health care provider before I take this medicine? They need to know if you have any of these conditions:  bleeding disorders  bipolar disorder or a family history of bipolar disorder  glaucoma  heart disease  high blood pressure  history of irregular  heartbeat  history of low levels of calcium, magnesium, or potassium in the blood  if you often drink alcohol  liver disease  receiving electroconvulsive therapy  seizures  suicidal thoughts, plans, or attempt; a previous suicide attempt by you or a family member  take medicines that treat or prevent blood clots  thyroid disease  an unusual or allergic reaction to sertraline, other medicines, foods, dyes, or preservatives  pregnant or trying to get pregnant  breast-feeding How should I use this medicine? Take this medicine by mouth with a glass of water. Follow the directions on the prescription label. You can take it with or without food. Take your medicine at regular intervals. Do not take your medicine more often than directed. Do not stop taking this medicine suddenly except upon the advice of your doctor. Stopping this medicine too quickly may cause serious side effects or your condition may worsen. A special MedGuide will be given to you by the pharmacist with each prescription and refill. Be sure to read this information carefully each time. Talk to your pediatrician regarding the use of this medicine in children. While this drug may be prescribed for children as young as 7 years for selected conditions, precautions do apply. Overdosage: If  you think you have taken too much of this medicine contact a poison control center or emergency room at once. NOTE: This medicine is only for you. Do not share this medicine with others. What if I miss a dose? If you miss a dose, take it as soon as you can. If it is almost time for your next dose, take only that dose. Do not take double or extra doses. What may interact with this medicine? Do not take this medicine with any of the following medications:  cisapride  dronedarone  linezolid  MAOIs like Carbex, Eldepryl, Marplan, Nardil, and Parnate  methylene blue (injected into a vein)  pimozide  thioridazine This medicine may  also interact with the following medications:  alcohol  amphetamines  aspirin and aspirin-like medicines  certain medicines for depression, anxiety, or psychotic disturbances  certain medicines for fungal infections like ketoconazole, fluconazole, posaconazole, and itraconazole  certain medicines for irregular heart beat like flecainide, quinidine, propafenone  certain medicines for migraine headaches like almotriptan, eletriptan, frovatriptan, naratriptan, rizatriptan, sumatriptan, zolmitriptan  certain medicines for sleep  certain medicines for seizures like carbamazepine, valproic acid, phenytoin  certain medicines that treat or prevent blood clots like warfarin, enoxaparin, dalteparin  cimetidine  digoxin  diuretics  fentanyl  isoniazid  lithium  NSAIDs, medicines for pain and inflammation, like ibuprofen or naproxen  other medicines that prolong the QT interval (cause an abnormal heart rhythm) like dofetilide  rasagiline  safinamide  supplements like St. John's wort, kava kava, valerian  tolbutamide  tramadol  tryptophan This list may not describe all possible interactions. Give your health care provider a list of all the medicines, herbs, non-prescription drugs, or dietary supplements you use. Also tell them if you smoke, drink alcohol, or use illegal drugs. Some items may interact with your medicine. What should I watch for while using this medicine? Tell your doctor if your symptoms do not get better or if they get worse. Visit your doctor or health care professional for regular checks on your progress. Because it may take several weeks to see the full effects of this medicine, it is important to continue your treatment as prescribed by your doctor. Patients and their families should watch out for new or worsening thoughts of suicide or depression. Also watch out for sudden changes in feelings such as feeling anxious, agitated, panicky, irritable, hostile,  aggressive, impulsive, severely restless, overly excited and hyperactive, or not being able to sleep. If this happens, especially at the beginning of treatment or after a change in dose, call your health care professional. Dennis Bast may get drowsy or dizzy. Do not drive, use machinery, or do anything that needs mental alertness until you know how this medicine affects you. Do not stand or sit up quickly, especially if you are an older patient. This reduces the risk of dizzy or fainting spells. Alcohol may interfere with the effect of this medicine. Avoid alcoholic drinks. Your mouth may get dry. Chewing sugarless gum or sucking hard candy, and drinking plenty of water may help. Contact your doctor if the problem does not go away or is severe. What side effects may I notice from receiving this medicine? Side effects that you should report to your doctor or health care professional as soon as possible:  allergic reactions like skin rash, itching or hives, swelling of the face, lips, or tongue  anxious  black, tarry stools  changes in vision  confusion  elevated mood, decreased need for sleep, racing thoughts, impulsive behavior  eye pain  fast, irregular heartbeat  feeling faint or lightheaded, falls  feeling agitated, angry, or irritable  hallucination, loss of contact with reality  loss of balance or coordination  loss of memory  painful or prolonged erections  restlessness, pacing, inability to keep still  seizures  stiff muscles  suicidal thoughts or other mood changes  trouble sleeping  unusual bleeding or bruising  unusually weak or tired  vomiting Side effects that usually do not require medical attention (report to your doctor or health care professional if they continue or are bothersome):  change in appetite or weight  change in sex drive or performance  diarrhea  increased sweating  indigestion, nausea  tremors This list may not describe all possible  side effects. Call your doctor for medical advice about side effects. You may report side effects to FDA at 1-800-FDA-1088. Where should I keep my medicine? Keep out of the reach of children. Store at room temperature between 15 and 30 degrees C (59 and 86 degrees F). Throw away any unused medicine after the expiration date. NOTE: This sheet is a summary. It may not cover all possible information. If you have questions about this medicine, talk to your doctor, pharmacist, or health care provider.  2021 Elsevier/Gold Standard (2020-03-04 09:18:23)

## 2020-06-24 NOTE — Telephone Encounter (Signed)
Agree, thank you
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
## 2020-06-25 MED ORDER — METHOCARBAMOL 500 MG PO TABS
500.0000 mg | ORAL_TABLET | Freq: Three times a day (TID) | ORAL | 0 refills | Status: DC | PRN
Start: 2020-06-25 — End: 2020-09-27
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
## 2020-06-28 ENCOUNTER — Encounter: Payer: Self-pay | Admitting: Internal Medicine
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
## 2020-06-28 NOTE — Progress Notes (Signed)
Internal Medicine Clinic Attending  Case discussed with Dr. Lee  At the time of the visit.  We reviewed the resident's history and exam and pertinent patient test results.  I agree with the assessment, diagnosis, and plan of care documented in the resident's note.    

## 2020-06-30 ENCOUNTER — Ambulatory Visit (INDEPENDENT_AMBULATORY_CARE_PROVIDER_SITE_OTHER): Payer: 59 | Admitting: Internal Medicine

## 2020-06-30 ENCOUNTER — Encounter: Payer: Self-pay | Admitting: Internal Medicine

## 2020-06-30 ENCOUNTER — Other Ambulatory Visit: Payer: Self-pay

## 2020-06-30 VITALS — BP 153/94 | HR 64 | Temp 98.2°F | Ht 64.0 in | Wt 233.4 lb

## 2020-06-30 DIAGNOSIS — B351 Tinea unguium: Secondary | ICD-10-CM | POA: Diagnosis not present

## 2020-06-30 NOTE — Assessment & Plan Note (Signed)
Has not taken her blood pressure medications for a while because she states her BP at home is 98/79. She thinks the high BP happens when she becomes upset. She is going to check BP x 2 daily and give [place] a call in 2 weeks with result and to see if we should resume her HCTZ.
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
## 2020-07-02 DIAGNOSIS — B351 Tinea unguium: Secondary | ICD-10-CM | POA: Insufficient documentation

## 2020-07-02 NOTE — Assessment & Plan Note (Signed)
Stable today. She states she does not feel bad as she did before starting medication. She follows with Dr. Theodis Shove. No SI or HI. -continue Sertraline -F/U w Dr. Theodis Shove

## 2020-07-02 NOTE — Assessment & Plan Note (Signed)
Bilateral mild onychomycosis of second and third toes.  -Will refer to podiatrist per patient's request.
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
## 2020-07-05 ENCOUNTER — Other Ambulatory Visit: Payer: Self-pay | Admitting: Internal Medicine

## 2020-07-09 ENCOUNTER — Other Ambulatory Visit: Payer: Self-pay

## 2020-07-09 ENCOUNTER — Ambulatory Visit: Payer: 59 | Attending: Internal Medicine

## 2020-07-09 DIAGNOSIS — M6281 Muscle weakness (generalized): Secondary | ICD-10-CM | POA: Diagnosis present

## 2020-07-09 DIAGNOSIS — M62830 Muscle spasm of back: Secondary | ICD-10-CM | POA: Diagnosis present

## 2020-07-09 DIAGNOSIS — M25571 Pain in right ankle and joints of right foot: Secondary | ICD-10-CM | POA: Insufficient documentation

## 2020-07-09 DIAGNOSIS — M79662 Pain in left lower leg: Secondary | ICD-10-CM | POA: Insufficient documentation

## 2020-07-09 DIAGNOSIS — M542 Cervicalgia: Secondary | ICD-10-CM | POA: Diagnosis not present

## 2020-07-09 DIAGNOSIS — R2689 Other abnormalities of gait and mobility: Secondary | ICD-10-CM | POA: Insufficient documentation

## 2020-07-09 DIAGNOSIS — M79661 Pain in right lower leg: Secondary | ICD-10-CM | POA: Insufficient documentation

## 2020-07-09 DIAGNOSIS — M62831 Muscle spasm of calf: Secondary | ICD-10-CM | POA: Diagnosis present

## 2020-07-09 DIAGNOSIS — M25671 Stiffness of right ankle, not elsewhere classified: Secondary | ICD-10-CM | POA: Insufficient documentation

## 2020-07-09 DIAGNOSIS — M25672 Stiffness of left ankle, not elsewhere classified: Secondary | ICD-10-CM | POA: Diagnosis present

## 2020-07-09 DIAGNOSIS — M545 Low back pain, unspecified: Secondary | ICD-10-CM | POA: Diagnosis present

## 2020-07-09 DIAGNOSIS — M25572 Pain in left ankle and joints of left foot: Secondary | ICD-10-CM | POA: Diagnosis present
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
## 2020-07-09 NOTE — Progress Notes (Signed)
Internal Medicine Clinic Attending  Case discussed with Dr. Masoudi  At the time of the visit.  We reviewed the resident's history and exam and pertinent patient test results.  I agree with the assessment, diagnosis, and plan of care documented in the resident's note.  

## 2020-07-09 NOTE — Patient Instructions (Signed)
Access Code: [email or web address] URL: [email or web address] Date: 07/09/2020 Prepared by: Sherlynn Stalls  Exercises Seated Upper Trapezius Stretch - 1 x daily - 7 x weekly - 3 sets - 20-30 seconds hold Gentle Levator Scapulae Stretch - 1 x daily - 7 x weekly - 3 sets - 20-30 seconds hold Lower Trunk Rotations - 1 x daily - 7 x weekly - 1 sets - 10 reps - 10 second hold Hooklying Single Knee to Chest Stretch - 1 x daily - 7 x weekly - 3 sets - 20- 30 seconds hold Standing Gastroc Stretch at Counter - 1 x daily - 7 x weekly - 3 sets - 20- 30 seconds hold

## 2020-07-14 ENCOUNTER — Encounter (HOSPITAL_COMMUNITY): Payer: 59 | Admitting: Psychiatry

## 2020-07-15 ENCOUNTER — Ambulatory Visit: Payer: 59

## 2020-07-15 ENCOUNTER — Other Ambulatory Visit: Payer: Self-pay

## 2020-07-15 ENCOUNTER — Ambulatory Visit: Payer: 59 | Admitting: Behavioral Health

## 2020-07-15 ENCOUNTER — Telehealth: Payer: Self-pay | Admitting: Behavioral Health

## 2020-07-15 DIAGNOSIS — M79661 Pain in right lower leg: Secondary | ICD-10-CM

## 2020-07-15 DIAGNOSIS — M542 Cervicalgia: Secondary | ICD-10-CM

## 2020-07-15 DIAGNOSIS — M25572 Pain in left ankle and joints of left foot: Secondary | ICD-10-CM

## 2020-07-15 DIAGNOSIS — R2689 Other abnormalities of gait and mobility: Secondary | ICD-10-CM

## 2020-07-15 DIAGNOSIS — M62830 Muscle spasm of back: Secondary | ICD-10-CM

## 2020-07-15 DIAGNOSIS — M25672 Stiffness of left ankle, not elsewhere classified: Secondary | ICD-10-CM

## 2020-07-15 DIAGNOSIS — M62831 Muscle spasm of calf: Secondary | ICD-10-CM

## 2020-07-15 DIAGNOSIS — M25571 Pain in right ankle and joints of right foot: Secondary | ICD-10-CM

## 2020-07-15 DIAGNOSIS — M6281 Muscle weakness (generalized): Secondary | ICD-10-CM

## 2020-07-15 DIAGNOSIS — M79662 Pain in left lower leg: Secondary | ICD-10-CM

## 2020-07-15 DIAGNOSIS — M25671 Stiffness of right ankle, not elsewhere classified: Secondary | ICD-10-CM

## 2020-07-15 DIAGNOSIS — M545 Low back pain, unspecified: Secondary | ICD-10-CM
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
## 2020-07-15 NOTE — Telephone Encounter (Signed)
Contacted Pt twice for telehealth session today. Unable to leave a msg for Pt as 'Mailbox is full." Will have Pt r/s'd by [employer].  Dr. Theodis Shove

## 2020-07-16 ENCOUNTER — Ambulatory Visit: Payer: 59 | Admitting: Physical Therapy

## 2020-07-20 ENCOUNTER — Ambulatory Visit: Payer: 59 | Admitting: Physical Therapy

## 2020-07-22 ENCOUNTER — Ambulatory Visit: Payer: 59

## 2020-07-22 ENCOUNTER — Other Ambulatory Visit: Payer: Self-pay

## 2020-07-22 DIAGNOSIS — M79661 Pain in right lower leg: Secondary | ICD-10-CM

## 2020-07-22 DIAGNOSIS — R2689 Other abnormalities of gait and mobility: Secondary | ICD-10-CM

## 2020-07-22 DIAGNOSIS — M79662 Pain in left lower leg: Secondary | ICD-10-CM

## 2020-07-22 DIAGNOSIS — M25571 Pain in right ankle and joints of right foot: Secondary | ICD-10-CM

## 2020-07-22 DIAGNOSIS — M25572 Pain in left ankle and joints of left foot: Secondary | ICD-10-CM

## 2020-07-22 DIAGNOSIS — M62830 Muscle spasm of back: Secondary | ICD-10-CM

## 2020-07-22 DIAGNOSIS — M545 Low back pain, unspecified: Secondary | ICD-10-CM

## 2020-07-22 DIAGNOSIS — M542 Cervicalgia: Secondary | ICD-10-CM

## 2020-07-22 DIAGNOSIS — M6281 Muscle weakness (generalized): Secondary | ICD-10-CM

## 2020-07-22 DIAGNOSIS — M25671 Stiffness of right ankle, not elsewhere classified: Secondary | ICD-10-CM

## 2020-07-22 DIAGNOSIS — M25672 Stiffness of left ankle, not elsewhere classified: Secondary | ICD-10-CM

## 2020-07-22 DIAGNOSIS — M62831 Muscle spasm of calf: Secondary | ICD-10-CM
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
## 2020-07-26 ENCOUNTER — Ambulatory Visit: Payer: 59

## 2020-07-26 IMAGING — DX PORTABLE CHEST - 1 VIEW
1 series · 1 of 1 positions shown · non-contrast
Comparison: None.

CLINICAL DATA: Epigastric pain for the past few weeks.

EXAM:
PORTABLE CHEST 1 VIEW

[chest ap]
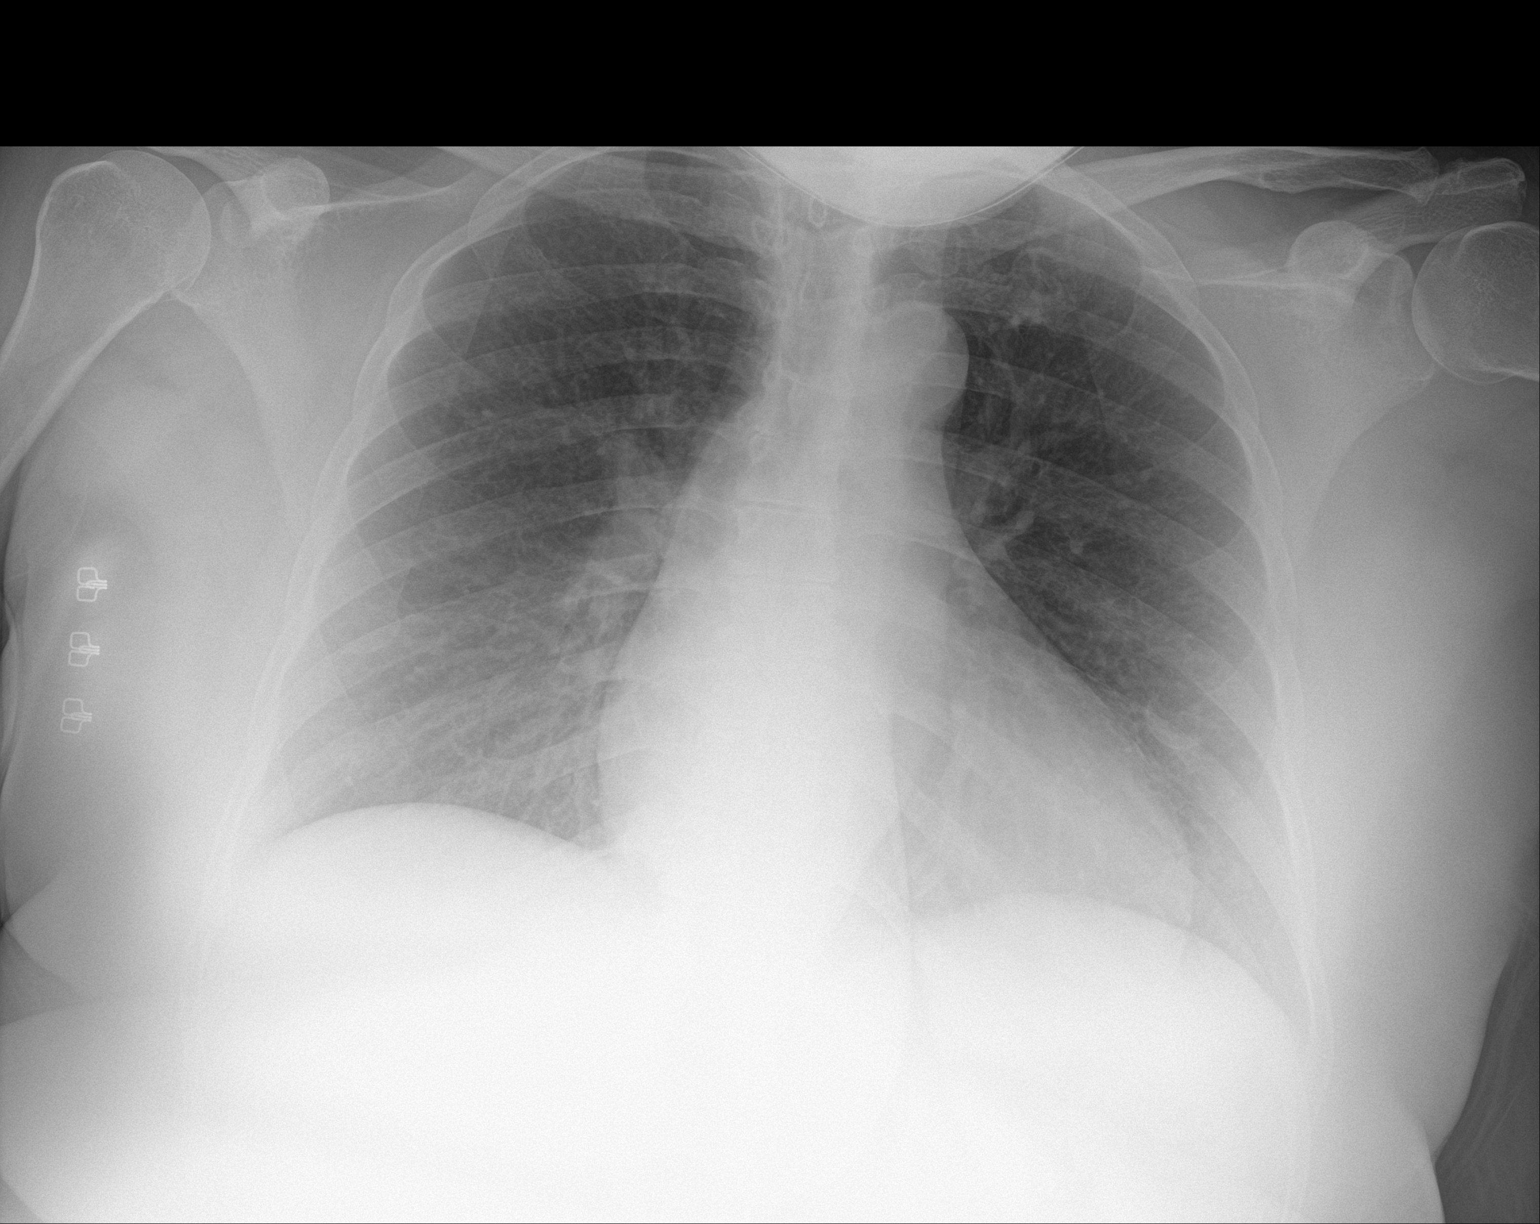

[1 of 1 positions shown; findings below may reference images not displayed]

FINDINGS: The heart size and mediastinal contours are within normal limits.
Both lungs are clear. The visualized skeletal structures are
unremarkable.
IMPRESSION: No active disease.

## 2020-07-28 ENCOUNTER — Ambulatory Visit: Payer: 59 | Admitting: Physical Therapy

## 2020-08-04 ENCOUNTER — Ambulatory Visit: Payer: 59 | Admitting: Behavioral Health

## 2020-08-04 NOTE — Addendum Note (Signed)
Addended by: Hulan Fray on: 08/04/2020 05:14 PM   Modules accepted: Orders

## 2020-08-25 ENCOUNTER — Ambulatory Visit: Payer: 59 | Admitting: Behavioral Health

## 2020-08-25 ENCOUNTER — Other Ambulatory Visit: Payer: Self-pay

## 2020-08-25 DIAGNOSIS — F331 Major depressive disorder, recurrent, moderate: Secondary | ICD-10-CM

## 2020-08-25 DIAGNOSIS — F419 Anxiety disorder, unspecified: Secondary | ICD-10-CM

## 2020-08-25 NOTE — BH Specialist Note (Signed)
Integrated Behavioral Health via Telemedicine Visit  08/25/2020 Lynn Pennington [id number]  Number of Sun visits: 2/6 Session Start time: 2:00pm  Session End time: 2:30pm Total time: 30  Referring Provider: Almeta Monas., RN Patient/Family location: Pt in private location [facility] Provider location: Working remotely from home in private All persons participating in visit: Pt & Clinician Types of Service: Individual psychotherapy  I connected with [name] and/or [name] self via  Telephone or Geologist, engineering  (Video is Tree surgeon) and verified that I am speaking with the correct person using two identifiers. Discussed confidentiality: Yes   I discussed the limitations of telemedicine and the availability of in person appointments.  Discussed there is a possibility of technology failure and discussed alternative modes of communication if that failure occurs.  I discussed that engaging in this telemedicine visit, they consent to the provision of behavioral healthcare and the services will be billed under their insurance.  Patient and/or legal guardian expressed understanding and consented to Telemedicine visit: Yes   Presenting Concerns: Patient and/or family reports the following symptoms/concerns: dec'd understanding of her situation by friends Duration of problem: since Jan 2022 [place]; Severity of problem: mild tending moderate & remitting trends id'd today  Patient and/or Family's Strengths/Protective Factors: Social connections, Social and Emotional competence, Concrete supports in place (healthy food, safe environments, etc.), Sense of purpose and Physical Health (exercise, healthy diet, medication compliance, etc.)  Goals Addressed: Patient will: 1.  Reduce symptoms of: anxiety and depression  2.  Increase knowledge and/or ability of: coping skills and stress reduction  3.  Demonstrate ability to: Increase healthy  adjustment to current life circumstances  Progress towards Goals: Ongoing  Interventions: Interventions utilized:  Solution-Focused Strategies and Supportive Counseling Standardized Assessments completed: Not Needed  Patient and/or Family Response: Pt receptive to call today & requests future check-in sessions  Assessment: Patient currently experiencing reduced Sx due to medication compliance w/sertraline 25mg  & psychotherapy consistency.   Patient may benefit from cont'd check-in sessions & TIC.  Plan: 1. Follow up with behavioral health clinician on : early May for 30 min check-in on telehealth 2. Behavioral recommendations: cont current suggestions for mgmt of Px & Mtl health wellness 3. Referral(s): [place] (In Clinic)  I discussed the assessment and treatment plan with the patient and/or parent/guardian. They were provided an opportunity to ask questions and all were answered. They agreed with the plan and demonstrated an understanding of the instructions.   They were advised to call back or seek an in-person evaluation if the symptoms worsen or if the condition fails to improve as anticipated.  Donnetta Hutching, LMFT

## 2020-08-30 ENCOUNTER — Telehealth: Payer: Self-pay

## 2020-08-30 DIAGNOSIS — R635 Abnormal weight gain: Secondary | ICD-10-CM

## 2020-08-30 NOTE — Telephone Encounter (Signed)
Returned call to patient. States she needs a referral to podiatry. Seen at [facility] on 06/30/20 and diagnosed with Onychomycosis of toenail. Referral placed at that time to [facility]. Patient made aware that they left 2 VMs and mailed a post card to call their office to schedule appt. Patient was given their contact info: [phone]   Also, states she's put on a lot of weight since starting sertraline. States Dr. Theodis Shove wants her to continue taking this. Requesting referral to nutritionist. BMI > 40.

## 2020-08-30 NOTE — Telephone Encounter (Signed)
Pls contact pt [phone] regarding medicine

## 2020-09-06 ENCOUNTER — Telehealth: Payer: Self-pay | Admitting: Internal Medicine

## 2020-09-06 NOTE — Telephone Encounter (Signed)
Pt requesting a call back about her Blood Thinner Medication and taking Naproxen for her ankle pain and is concerned.

## 2020-09-06 NOTE — Telephone Encounter (Signed)
Called pt for more details; no answer, left message to call the office.

## 2020-09-12 ENCOUNTER — Other Ambulatory Visit: Payer: Self-pay | Admitting: Student

## 2020-09-12 DIAGNOSIS — Z86718 Personal history of other venous thrombosis and embolism: Secondary | ICD-10-CM

## 2020-09-15 MED ORDER — RIVAROXABAN 10 MG PO TABS
ORAL_TABLET | ORAL | 1 refills | Status: DC
Start: 2020-09-15 — End: 2021-07-28

## 2020-09-15 NOTE — Assessment & Plan Note (Signed)
Patient was changed to 10mg  dose in 02/2020 after this was discussed with Dr. Court Joy. "Called and discussed reducing dose of Xarelto with patient. Her last DVT was in 2016 and she has been on Xarelto since. Will prescribe reduced dose, 10 mg Xarelto daily prophylaxis. Change based on [facility] choice trial"  - refilled Xarelto 10mg  daily

## 2020-09-16 ENCOUNTER — Telehealth: Payer: Self-pay

## 2020-09-16 ENCOUNTER — Emergency Department (HOSPITAL_COMMUNITY): Payer: 59

## 2020-09-16 ENCOUNTER — Encounter (HOSPITAL_COMMUNITY): Payer: Self-pay

## 2020-09-16 ENCOUNTER — Other Ambulatory Visit: Payer: Self-pay

## 2020-09-16 ENCOUNTER — Emergency Department (HOSPITAL_COMMUNITY)
Admission: EM | Admit: 2020-09-16 | Discharge: 2020-09-16 | Disposition: A | Discharge status: Home / Self Care | Payer: 59 | Attending: Emergency Medicine | Admitting: Emergency Medicine

## 2020-09-16 DIAGNOSIS — J45909 Unspecified asthma, uncomplicated: Secondary | ICD-10-CM | POA: Insufficient documentation

## 2020-09-16 DIAGNOSIS — Z7901 Long term (current) use of anticoagulants: Secondary | ICD-10-CM | POA: Diagnosis not present

## 2020-09-16 DIAGNOSIS — Z7951 Long term (current) use of inhaled steroids: Secondary | ICD-10-CM | POA: Diagnosis not present

## 2020-09-16 DIAGNOSIS — Z79899 Other long term (current) drug therapy: Secondary | ICD-10-CM | POA: Diagnosis not present

## 2020-09-16 DIAGNOSIS — I10 Essential (primary) hypertension: Secondary | ICD-10-CM | POA: Insufficient documentation

## 2020-09-16 DIAGNOSIS — F17210 Nicotine dependence, cigarettes, uncomplicated: Secondary | ICD-10-CM | POA: Insufficient documentation

## 2020-09-16 DIAGNOSIS — R509 Fever, unspecified: Secondary | ICD-10-CM | POA: Diagnosis present

## 2020-09-16 DIAGNOSIS — U071 COVID-19: Secondary | ICD-10-CM | POA: Diagnosis not present

## 2020-09-16 LAB — RESP PANEL BY RT-PCR (FLU A&B, COVID) ARPGX2
Influenza A by PCR: NEGATIVE
Influenza B by PCR: NEGATIVE
SARS Coronavirus 2 by RT PCR: POSITIVE — AB

## 2020-09-16 MED ORDER — ACETAMINOPHEN 500 MG PO TABS: 500.0000 mg | ORAL_TABLET | Freq: Four times a day (QID) | ORAL | 0 refills | Status: DC | PRN

## 2020-09-16 MED ORDER — BENZONATATE 100 MG PO CAPS: 100.0000 mg | ORAL_CAPSULE | Freq: Three times a day (TID) | ORAL | 0 refills | Status: DC

## 2020-09-16 MED ORDER — ACETAMINOPHEN 325 MG PO TABS
650.0000 mg | ORAL_TABLET | Freq: Once | ORAL | Status: AC
  Administered 2020-09-16: 650 mg via ORAL
  Filled 2020-09-16: qty 2

## 2020-09-16 NOTE — ED Notes (Signed)
Xray at the bedside.

## 2020-09-16 NOTE — ED Triage Notes (Signed)
Pt presents with c/o being exposed to Covid several days ago and now experiencing some body aches, headache, and cough. Pt in NAD.

## 2020-09-16 NOTE — Telephone Encounter (Signed)
Pls contact pt regarding COVID breathing issues [phone]
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
## 2020-09-16 NOTE — Discharge Instructions (Signed)
You have been tested positive for COVID infection.  Please follow instruction below.  Recommendations for at home COVID-19 symptoms management:  Please continue isolation at home. Call [phone] to see whether you might be eligible for therapeutic antibody infusions (leave your name and they will call you back).  If have acute worsening of symptoms please go to ER/urgent care for further evaluation. Check pulse oximetry and if below 90-92% please go to ER. The following supplements MAY help:  Vitamin C 500mg  twice a day and Quercetin 250-500 mg twice a day Vitamin D3 2000 - 4000 u/day B Complex vitamins Zinc 75-100 mg/day Melatonin 6-10 mg at night (the optimal dose is unknown) Aspirin 81mg /day (if no history of bleeding issues)

## 2020-09-16 NOTE — Telephone Encounter (Signed)
Agree, thank you

## 2020-09-16 NOTE — Telephone Encounter (Signed)
Returned call to patient. States she and her mother are both positive for Covid. She had received only 1 vaccine for Covid ~ 4 months ago. She has a hx of controlled asthma. States she has been [facility] with intermittent CP x 2 days. Instructed to head directly to ED. States she lives very close to [facility] hospital and will head there now.

## 2020-09-21 ENCOUNTER — Telehealth: Payer: Self-pay | Admitting: Behavioral Health

## 2020-09-21 ENCOUNTER — Other Ambulatory Visit: Payer: Self-pay

## 2020-09-21 ENCOUNTER — Ambulatory Visit: Payer: 59 | Admitting: Behavioral Health

## 2020-09-21 NOTE — Telephone Encounter (Signed)
Contacted Pt for 1:30pm appt. Lft msg for Pt to return call to [facility] to r/s.  Dr. Theodis Shove

## 2020-09-27 ENCOUNTER — Ambulatory Visit (INDEPENDENT_AMBULATORY_CARE_PROVIDER_SITE_OTHER): Payer: 59 | Admitting: Internal Medicine

## 2020-09-27 ENCOUNTER — Encounter: Payer: Self-pay | Admitting: Internal Medicine

## 2020-09-27 ENCOUNTER — Other Ambulatory Visit: Payer: Self-pay

## 2020-09-27 VITALS — BP 155/97 | HR 63 | Temp 99.0°F | Ht 64.0 in | Wt 239.4 lb

## 2020-09-27 DIAGNOSIS — R079 Chest pain, unspecified: Secondary | ICD-10-CM

## 2020-09-27 DIAGNOSIS — I10 Essential (primary) hypertension: Secondary | ICD-10-CM | POA: Diagnosis not present

## 2020-09-27 MED ORDER — HYDROCHLOROTHIAZIDE 25 MG PO TABS: 25.0000 mg | ORAL_TABLET | Freq: Every morning | ORAL | 1 refills | Status: DC

## 2020-09-27 NOTE — Patient Instructions (Addendum)
Thank you for trusting me with your care. To recap, today we discussed the following:  1. Primary hypertension - hydrochlorothiazide (HYDRODIURIL) 25 MG tablet; Take 1 tablet (25 mg total) by mouth every morning.  Dispense: 90 tablet; Refill: 1  2. Chest pain, unspecified type  - Myocardial Perfusion Imaging; Future - EKG 12-Lead

## 2020-09-27 NOTE — Progress Notes (Signed)
   CC: Recent Covid-19 infection , primary hypertension, and chest pain  HPI:[name] is a 50 y.o. female who presents for evaluation of recent Covid-19 infection, primary hypertension, and chest pain. Please see individual problem based A/P for details.  Past Medical History:  Diagnosis Date  . Anxiety   . Asthma   . Chronic leg pain   . Depression   . DVT (deep venous thrombosis) ([place]) 2010, 2016  . Hypertension   . Iron deficiency    Review of Systems:   Review of Systems  Constitutional: Negative for chills and fever.  Cardiovascular: Negative for chest pain, palpitations, orthopnea and leg swelling.  Gastrointestinal: Negative for abdominal pain, heartburn and nausea.     Physical Exam: Vitals:   09/27/20 1549  BP: (!) 155/97  Pulse: 63  Temp: 99 F (37.2 C)  TempSrc: Oral  SpO2: 100%  Weight: 239 lb 6.4 oz (108.6 kg)  Height: 5\' 4"  (1.626 m)   General: NAD, nl appearing HEENT: Conjunctiva nl , antiicteric sclerae, moist mucous membranes, no exudate or erythema Cardiovascular: Normal rate, regular rhythm.  No murmurs, rubs, or gallops, chest wall non tender to palpation Pulmonary : Equal breath sounds, No wheezes, rales, or rhonchi Abdominal: soft, nontender,  bowel sounds present Ext: No edema in lower extremities, no tenderness to palpation of lower extremities.   Assessment & Plan:   See Encounters Tab for problem based charting.  Patient discussed with Dr. Philipp Ovens

## 2020-09-28 ENCOUNTER — Telehealth: Payer: Self-pay

## 2020-09-28 ENCOUNTER — Encounter: Payer: Self-pay | Admitting: Internal Medicine

## 2020-09-28 DIAGNOSIS — R079 Chest pain, unspecified: Secondary | ICD-10-CM | POA: Insufficient documentation
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
## 2020-09-28 NOTE — Telephone Encounter (Signed)
Returned call to patient. Explained pharmacies are no longer requiring Rxs for vaccinations. She can sign up on line. She will go to [facility] to schedule appt and call us back if anything is needed.

## 2020-09-28 NOTE — Telephone Encounter (Signed)
Requesting Rx for Shingle shot @  [id number] [place], [place] Phone:  [phone]  Fax:  [phone]

## 2020-09-28 NOTE — Telephone Encounter (Signed)
Agree, thank you

## 2020-09-28 NOTE — Assessment & Plan Note (Signed)
Patient has a history of HTN, off of HCTZ. Reports before her depression was treated she did not like taking medications. She was on HCTZ.Today patient BP: (!) 155/97  Assessment: Uncontrolled HTN Plan: - hydrochlorothiazide (HYDRODIURIL) 25 MG tablet; Take 1 tablet (25 mg total) by mouth every morning.  Dispense: 90 tablet; Refill: 1

## 2020-09-29 NOTE — Progress Notes (Signed)
Internal Medicine Clinic Attending  Case discussed with Dr. Steen  At the time of the visit.  We reviewed the resident's history and exam and pertinent patient test results.  I agree with the assessment, diagnosis, and plan of care documented in the resident's note.  

## 2020-09-30 ENCOUNTER — Encounter: Payer: Self-pay | Admitting: *Deleted

## 2020-10-28 ENCOUNTER — Telehealth: Payer: Self-pay | Admitting: *Deleted

## 2020-10-28 ENCOUNTER — Encounter: Payer: 59 | Admitting: Internal Medicine

## 2020-10-28 NOTE — Telephone Encounter (Signed)
Pt did not come her appt this afternoon - pt was called to re-schedule.Appt re-schedule for Monday 6/27 @ 1545 PM.

## 2020-10-29 ENCOUNTER — Encounter: Payer: 59 | Admitting: Internal Medicine

## 2020-11-01 ENCOUNTER — Encounter: Payer: 59 | Admitting: Internal Medicine

## 2020-11-10 ENCOUNTER — Telehealth: Payer: Self-pay

## 2020-11-10 ENCOUNTER — Ambulatory Visit (INDEPENDENT_AMBULATORY_CARE_PROVIDER_SITE_OTHER): Payer: 59 | Admitting: Internal Medicine

## 2020-11-10 ENCOUNTER — Encounter: Payer: Self-pay | Admitting: Internal Medicine

## 2020-11-10 VITALS — BP 151/94 | HR 56 | Temp 98.1°F | Wt 246.4 lb

## 2020-11-10 DIAGNOSIS — I10 Essential (primary) hypertension: Secondary | ICD-10-CM

## 2020-11-10 DIAGNOSIS — Z0000 Encounter for general adult medical examination without abnormal findings: Secondary | ICD-10-CM | POA: Diagnosis not present

## 2020-11-10 DIAGNOSIS — K068 Other specified disorders of gingiva and edentulous alveolar ridge: Secondary | ICD-10-CM | POA: Insufficient documentation

## 2020-11-10 DIAGNOSIS — Z86718 Personal history of other venous thrombosis and embolism: Secondary | ICD-10-CM | POA: Diagnosis not present

## 2020-11-10 DIAGNOSIS — Z23 Encounter for immunization: Secondary | ICD-10-CM

## 2020-11-10 MED ORDER — LISINOPRIL 10 MG PO TABS: 10.0000 mg | ORAL_TABLET | Freq: Every day | ORAL | 11 refills | Status: DC

## 2020-11-10 NOTE — Assessment & Plan Note (Addendum)
Assessment: Patient presented to the clinic with bleeding gums for about 2 weeks. Ddx include bleeding 2/2 to vigorous brushing vs liver disease vs scurvy vs over anticoagulation. Most likely is bleeding 2/2 to forceful brushing as bleeding occurs only with brushing and only the gums are affected. Their is minimal pain which would indicate other oral problems. Liver disease and over anticoagulation are less likely as patient is only having bleeding from the gums and not anywhere else. She denies any easy bruising. Scurvy is less likely as patient endorses adequate nutrition. It started on 11/01/20 and has been present ever since. She gets bleeding every time she brushes her teeth. She states she wakes up with residual taste of blood in her mouth. Patient had recent   Plan: -Advised patient to use softer bristle brush and brush gently -Use a mouthwash that is targeted to gum health -Make an appointment with a dentist for dental checkup

## 2020-11-10 NOTE — Assessment & Plan Note (Signed)
Assessment: Patient has a history of HTN with current prescription of HCTZ. Patient does not take HCTZ as indicated but rather only a couple of times a week. She reports generalized swelling after taking HCTZ. Educated patient on complications of uncontrolled HTN.  Plan: -Switch HCTZ 25 mg to Lisinopril 10 mg daily -Keep a blood pressure dairy and will adjust dose after 1 week FU.

## 2020-11-10 NOTE — Assessment & Plan Note (Signed)
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 Patient was on 20 mg Xarelto and was switched to 10 mg Xarelto daily.   Plan: -Continue Xarelto 10 mg qd

## 2020-11-10 NOTE — Assessment & Plan Note (Addendum)
Patient had multiple health gaps that were addressed in this visit on 11/10/20.   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

## 2020-11-10 NOTE — Assessment & Plan Note (Signed)
Assessment: Patient has history of asthma well controlled with albuterol. Patient uses albuterol 2-3 a month.   Plan: -Continue to monitor

## 2020-11-10 NOTE — Progress Notes (Signed)
CC: "my gums are bleeding"  HPI:  [name] is a 50 y.o. with medical history as below presenting to [facility] for bleeding gums.  Please see problem-based list for further details, assessments, and plans.  Past Medical History:  Diagnosis Date   Anxiety    Asthma    Chronic leg pain    Depression    DVT (deep venous thrombosis) ([place]) 2010, 2016   Hypertension    Iron deficiency    Review of Systems:   Review of Systems  Constitutional:  Negative for chills and fever.  HENT:  Negative for hearing loss and tinnitus.   Eyes:  Positive for blurred vision. Negative for double vision, photophobia and pain.  Respiratory:  Negative for cough, hemoptysis and shortness of breath.   Cardiovascular:  Positive for chest pain. Negative for palpitations and orthopnea.  Gastrointestinal:  Positive for heartburn. Negative for blood in stool, melena, nausea and vomiting.  Genitourinary:  Negative for dysuria and urgency.  Musculoskeletal:  Negative for myalgias and neck pain.  Skin:  Negative for itching and rash.  Neurological:  Positive for dizziness. Negative for tingling and headaches.  Endo/Heme/Allergies:  Negative for environmental allergies. Does not bruise/bleed easily.  Psychiatric/Behavioral:  Negative for depression and suicidal ideas.     Physical Exam:  Vitals:   11/10/20 1346 11/10/20 1423  BP: (!) 156/81 (!) 151/94  Pulse: 63 (!) 56  Temp: 98.1 F (36.7 C)   TempSrc: Oral   SpO2: 100%   Weight: 246 lb 6.4 oz (111.8 kg)     Physical Exam Constitutional:      General: She is not in acute distress.    Appearance: Normal appearance. She is not ill-appearing.  HENT:     Head: Normocephalic and atraumatic.     Right Ear: External ear normal.     Left Ear: External ear normal.     Nose: Nose normal.     Mouth/Throat:     Mouth: Mucous membranes are moist.     Dentition: Gum lesions (gums looked irritated) present.     Pharynx: Oropharynx is clear. Posterior  oropharyngeal erythema: gums looked irritated.  Eyes:     Extraocular Movements: Extraocular movements intact.     Conjunctiva/sclera: Conjunctivae normal.     Pupils: Pupils are equal, round, and reactive to light.  Cardiovascular:     Rate and Rhythm: Normal rate and regular rhythm.     Pulses: Normal pulses.     Heart sounds: Normal heart sounds. No murmur heard.   No friction rub. No gallop.  Pulmonary:     Effort: Pulmonary effort is normal. No respiratory distress.     Breath sounds: Normal breath sounds. No stridor. No wheezing or rhonchi.  Abdominal:     General: Bowel sounds are normal. There is no distension.     Palpations: Abdomen is soft. There is no mass.  Musculoskeletal:        General: No swelling or tenderness. Normal range of motion.     Cervical back: Normal range of motion and neck supple.  Skin:    Capillary Refill: Capillary refill takes less than 2 seconds.     Coloration: Skin is not jaundiced.     Findings: No erythema, lesion or rash.  Neurological:     General: No focal deficit present.     Mental Status: She is alert and oriented to person, place, and time. Mental status is at baseline.  Psychiatric:  Mood and Affect: Mood normal.        Behavior: Behavior normal.     Assessment & Plan:   See Encounters Tab for problem based charting.  Patient seen with Dr.  Letha Cape, MD

## 2020-11-10 NOTE — Patient Instructions (Signed)
[name], it was a pleasure seeing you today!  Today we discussed: Gum Bleeding-Advised patient to use softer bristle tooth brush and a gingivitis specific mouth wash. Follow with dentist for dental care. Hypertension-Switched HCTZ to Lisinopril 10 mg qd as patient did not take the medication due to causing her to swell up.  Health Maintenance- Mammogram referral, TDAP vaccine adminstered and recommend to get shingles vaccine at the pharmacy.   I have ordered the following labs today:  Lab Orders  No laboratory test(s) ordered today     Tests ordered today: No test ordered today   Referrals ordered today:  -Patient advised to see a dentist as soon as possible and information regarding low cost dental care was provided.   I have ordered the following medication/changed the following medications:   Stop the following medications: Medications Discontinued During This Encounter  Medication Reason   hydrochlorothiazide (HYDRODIURIL) 25 MG tablet Discontinued by provider     Start the following medications: Meds ordered this encounter  Medications   lisinopril (ZESTRIL) 10 MG tablet    Sig: Take 1 tablet (10 mg total) by mouth daily.    Dispense:  30 tablet    Refill:  11     Follow-up: 1 week video visit  Please make sure to arrive 15 minutes prior to your next appointment. If you arrive late, you may be asked to reschedule.   We look forward to seeing you next time. Please call our clinic at [phone] if you have any questions or concerns. The best time to call is Monday-Friday from 9am-4pm, but there is someone available 24/7. If after hours or the weekend, call the main hospital number and ask for the Internal Medicine Resident On-Call. If you need medication refills, please notify your pharmacy one week in advance and they will send [place] a request.  Thank you for letting us take part in your care. Wishing you the best!  Thank you, Idamae Schuller, MD

## 2020-11-10 NOTE — Telephone Encounter (Signed)
TC transferred from front desk.  Patient c/o bleeding gums X 2 weeks.  States when she wakes up her gums have bled a lot during the night, bleed during the day some and when she brushes her teeth.  She also c/o 2 episodes of dizziness during past week.  Pt states she is currently taking a blood thinner, Xarelto.  Informed patient she will need to be seen in clinic, yellow team is full.  Appt made for today at 1315 w/ red team, Dr. Humphrey Rolls. SChaplin, RN,BSN

## 2020-11-17 ENCOUNTER — Ambulatory Visit (INDEPENDENT_AMBULATORY_CARE_PROVIDER_SITE_OTHER): Payer: Medicaid Other | Admitting: Family Medicine

## 2020-11-24 ENCOUNTER — Ambulatory Visit (INDEPENDENT_AMBULATORY_CARE_PROVIDER_SITE_OTHER): Payer: 59 | Admitting: Internal Medicine

## 2020-11-24 DIAGNOSIS — I10 Essential (primary) hypertension: Secondary | ICD-10-CM

## 2020-11-24 NOTE — Progress Notes (Signed)
Call attempted at 1:03, unable to leave VM (box is full)  Call attempted at 1:18, unable to leave VM

## 2020-11-29 ENCOUNTER — Other Ambulatory Visit: Payer: Self-pay

## 2020-11-29 DIAGNOSIS — F331 Major depressive disorder, recurrent, moderate: Secondary | ICD-10-CM

## 2020-11-29 MED ORDER — SERTRALINE HCL 25 MG PO TABS: 25.0000 mg | ORAL_TABLET | Freq: Every day | ORAL | 3 refills | Status: DC

## 2020-11-29 NOTE — Telephone Encounter (Signed)
Please arrange an office visit with me in September for follow up of her chronic medical conditions.  --Thank you

## 2020-12-27 ENCOUNTER — Ambulatory Visit: Payer: 59 | Admitting: Licensed Clinical Social Worker

## 2020-12-27 ENCOUNTER — Telehealth: Payer: Self-pay | Admitting: *Deleted

## 2020-12-27 NOTE — Telephone Encounter (Signed)
From: Drema Pry  Sent: 12/27/2020   1:36 PM EDT  To: Johnney Killian, RN  Subject: Patient request                                 Happy Monday Deb!   This patient is requesting a call back from [facility]. She states she is having ongoing bleeding with her gums. Patient stated she was taking a blood thinner and since, her gums keep bleeding.

## 2020-12-27 NOTE — Telephone Encounter (Signed)
I am happy to discuss this further at her follow up visit with me next month. I agree with Dr. 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 If she wants to be seen sooner, just ask her to schedule a visit with me earlier in the month. If she develops any major bleeding in the meantime, she needs to be seen sooner.

## 2020-12-27 NOTE — Telephone Encounter (Signed)
Patient was seen at [facility] on 7/6 for this issue with following Plan:  -Advised patient to use softer bristle brush and brush gently -Use a mouthwash that is targeted to gum health -Make an appointment with a dentist for dental checkup  Patient states there has been no improvement with softer brush or mouthwash. States she does not have dental coverage and cannot afford to see a dentist at this time. States every time she spits after brushing her teeth, "there is red, red, red." Also, notes small amt of blood on pillow each morning. Please advise.

## 2020-12-27 NOTE — Chronic Care Management (AMB) (Signed)
  Care Management   Social Work Visit Note  12/27/2020 Name: Lynn Pennington MRN: [id number] DOB: [birth year]  Lynn Pennington is a 50 y.o. year old female who sees Mitzi Hansen, MD for primary care. The care management team was consulted for assistance with care management and care coordination needs related to Lynn Pennington    Patient was given the following information about care management and care coordination services today, agreed to services, and gave verbal consent: 1.care management/care coordination services include personalized support from designated clinical staff supervised by their physician, including individualized plan of care and coordination with other care providers 2. 24/7 contact phone numbers for assistance for urgent and routine care needs. 3. The patient may stop care management/care coordination services at any time by phone call to the office staff.  Engaged with patient by telephone for initial visit in response to provider referral for social work chronic care management and care coordination services.  Assessment: Review of patient history, allergies, and health status during evaluation of patient need for care management/care coordination services.    Interventions:  Patient interviewed and appropriate assessments performed Collaborated with clinical team regarding patient needs  Patient requested assistance with SCAT. SW to complete application and submit to PCP. Patient requested assistance with local food pantries. SW sent e-mail to patient with listing of local pantries.  Patient requested assistance with disability application. Patient already submitted disability application. Patient not sure of what else is needed. SW advised patient to contact the disability office to inquire. SW gave patient the phone number.  Patient requested an outreach call from [facility]. Patient stated she is having bleeding with her gums. SW inbasket RNCM regarding request.  Patient  stated she has a support system and is new to [place] as she previously lived in [name].   SDOH (Social Determinants of Health) assessments performed: Yes SDOH Interventions    Flowsheet Row Most Recent Value  SDOH Interventions   Food Insecurity Interventions Other (Comment)  Transportation Interventions SCAT ([employer])         Plan:  patient will work with BSW to address needs related to Financial constraints  Social Worker will follow up with patient within 30-days.   Lynn Pennington, [place]  Social Worker [facility] Care Management  [phone]

## 2020-12-27 NOTE — Patient Instructions (Signed)
Visit Information  Instructions: patient will work with SW to address concerns related to financial constraints.  Patient was given the following information about care management and care coordination services today, agreed to services, and gave verbal consent: 1.care management/care coordination services include personalized support from designated clinical staff supervised by their physician, including individualized plan of care and coordination with other care providers 2. 24/7 contact phone numbers for assistance for urgent and routine care needs. 3. The patient may stop care management/care coordination services at any time by phone call to the office staff.  Patient verbalizes understanding of instructions provided today and agrees to view in [place].   The care management team will reach out to the patient again over the next 30 days.   Milus Height, [place]  Social Worker [facility] Care Management  [phone]

## 2020-12-27 NOTE — Telephone Encounter (Signed)
Returned call to patient. She states she will keep her appt on 9/26 with PCP. She is aware to call us back for sooner appt if bleeding worsens or to go to ED if after hours. She agrees.

## 2021-01-05 ENCOUNTER — Ambulatory Visit: Payer: Self-pay | Admitting: Licensed Clinical Social Worker

## 2021-01-05 NOTE — Patient Instructions (Signed)
Visit Information  Instructions: patient will work with SW to address concerns related to disability application.  Patient was given the following information about care management and care coordination services today, agreed to services, and gave verbal consent: 1.care management/care coordination services include personalized support from designated clinical staff supervised by their physician, including individualized plan of care and coordination with other care providers 2. 24/7 contact phone numbers for assistance for urgent and routine care needs. 3. The patient may stop care management/care coordination services at any time by phone call to the office staff.  Patient verbalizes understanding of instructions provided today and agrees to view in [place].   The care management team will reach out to the patient again over the next 30 days.   Milus Height, [place]  Social Worker [facility] Care Management  [phone]

## 2021-01-05 NOTE — Chronic Care Management (AMB) (Signed)
  Care Management   Social Work Visit Note  01/05/2021 Name: Lynn Pennington MRN: [id number] DOB: August 08, 1970  Lynn Pennington is a 50 y.o. year old female who sees Mitzi Hansen, MD for primary care. The care management team was consulted for assistance with care management and care coordination needs related to [facility] Resources    Patient was given the following information about care management and care coordination services today, agreed to services, and gave verbal consent: 1.care management/care coordination services include personalized support from designated clinical staff supervised by their physician, including individualized plan of care and coordination with other care providers 2. 24/7 contact phone numbers for assistance for urgent and routine care needs. 3. The patient may stop care management/care coordination services at any time by phone call to the office staff.  Engaged with patient by telephone for follow up visit in response to provider referral for social work chronic care management and care coordination services.  Assessment: Review of patient history, allergies, and health status during evaluation of patient need for care management/care coordination services.    Interventions:  Patient interviewed and appropriate assessments performed Collaborated with clinical team regarding patient needs  Patient called SW to discuss frustration with being denied for disability. Patient was informed she needed to began the process over. SW emailed patient resource information.  "[name],  Below you will find a resource for assistance with applying for disability. Please let me know if you need anything additional.   [facility]  [phone]"      Plan:  patient will work with BSW to address needs related to Financial constraints  Social Worker will follow up within 30-days.   Milus Height, [place]  Social Worker [facility] Care Management   [phone]

## 2021-01-24 ENCOUNTER — Telehealth: Payer: Self-pay

## 2021-01-24 ENCOUNTER — Encounter: Payer: 59 | Admitting: Internal Medicine

## 2021-01-24 NOTE — Telephone Encounter (Signed)
Please call pt back about getting bp meds.

## 2021-01-24 NOTE — Telephone Encounter (Signed)
Pt requesting refill on Telmisartan and hydrochlorothiazide 80/12.'5mg'$  tabs-states she taken one daily for the last 4 days.  Medication never prescribed by [facility] Her mother used to take it BP during call 142/90 Lisinopril on chart, but does not take it due to weight gain  Had an appt today, but canceled it  Pt advised not to take other people meds and to follow up with [facility] for BP and labs Appt given for Wed 01/26/21 with pcp.Despina Hidden Cassady9/19/202212:00 PM

## 2021-01-24 NOTE — Telephone Encounter (Signed)
Returned call to patient No answer HIPPA compliant message left on recorder.Lynn Pennington Cassady9/19/202211:17 AM

## 2021-01-26 ENCOUNTER — Encounter: Payer: Self-pay | Admitting: Internal Medicine

## 2021-01-26 ENCOUNTER — Other Ambulatory Visit: Payer: Self-pay

## 2021-01-26 ENCOUNTER — Ambulatory Visit (INDEPENDENT_AMBULATORY_CARE_PROVIDER_SITE_OTHER): Payer: 59 | Admitting: Internal Medicine

## 2021-01-26 VITALS — BP 138/83 | HR 65 | Temp 98.6°F | Ht 64.0 in | Wt 254.0 lb

## 2021-01-26 DIAGNOSIS — M7661 Achilles tendinitis, right leg: Secondary | ICD-10-CM | POA: Diagnosis not present

## 2021-01-26 DIAGNOSIS — Z0000 Encounter for general adult medical examination without abnormal findings: Secondary | ICD-10-CM

## 2021-01-26 DIAGNOSIS — Z86718 Personal history of other venous thrombosis and embolism: Secondary | ICD-10-CM | POA: Diagnosis not present

## 2021-01-26 DIAGNOSIS — I10 Essential (primary) hypertension: Secondary | ICD-10-CM | POA: Diagnosis not present

## 2021-01-26 DIAGNOSIS — Z1231 Encounter for screening mammogram for malignant neoplasm of breast: Secondary | ICD-10-CM

## 2021-01-26 DIAGNOSIS — M7662 Achilles tendinitis, left leg: Secondary | ICD-10-CM

## 2021-01-26 DIAGNOSIS — F331 Major depressive disorder, recurrent, moderate: Secondary | ICD-10-CM

## 2021-01-27 DIAGNOSIS — M7662 Achilles tendinitis, left leg: Secondary | ICD-10-CM | POA: Insufficient documentation

## 2021-01-27 DIAGNOSIS — M7661 Achilles tendinitis, right leg: Secondary | ICD-10-CM | POA: Insufficient documentation

## 2021-01-27 LAB — BASIC METABOLIC PANEL
BUN/Creatinine Ratio: 20 (ref 9–23)
BUN: 14 mg/dL (ref 6–24)
CO2: 23 mmol/L (ref 20–29)
Calcium: 9.4 mg/dL (ref 8.7–10.2)
Chloride: 103 mmol/L (ref 96–106)
Creatinine, Ser: 0.7 mg/dL (ref 0.57–1.00)
Glucose: 85 mg/dL (ref 65–99)
Potassium: 4.4 mmol/L (ref 3.5–5.2)
Sodium: 139 mmol/L (ref 134–144)
eGFR: 104 mL/min/{1.73_m2} (ref >59)

## 2021-01-27 NOTE — Assessment & Plan Note (Addendum)
This is a chronic issue. She was initially referred to podiatry in 2020 however does not appear to have done so. She was referred to them again in July of 2021 and was diagnosed with bilateral Achilles tendonitis and plantar fascitis. She was referred to PT. Her LOV with them was 01/2020 at which time they recommended follow up in 6w with consideration of an MRI if not improved. She did not follow up again after that visit.  She complains of persistent pain at today's visit and does not feel that it is being adequately managed. I agree with prior providers that opioid therapy should try to be avoided.   Recommended following up with podiatry for re-evaluation of this

## 2021-01-27 NOTE — Progress Notes (Signed)
Office Visit   Patient ID: Lynn Pennington, female    DOB: [birth year], 50 y.o.   MRN: [id number]   PCP: Mitzi Hansen, MD   Subjective:  Lynn Pennington is a 50 y.o. year old female who presents for follow up of chronic medical conditions including hypertension, depression and chronic leg pain. Please refer to problem based charting for assessment and plan.   ACTIVE MEDICATIONS   Outpatient Medications Prior to Visit  Medication Sig   albuterol (VENTOLIN HFA) 108 (90 Base) MCG/ACT inhaler Inhale 1-2 puffs into the lungs every 6 (six) hours as needed for wheezing or shortness of breath.   rivaroxaban (XARELTO) 10 MG TABS tablet TAKE 1 TABLET(10 MG) BY MOUTH DAILY   sertraline (ZOLOFT) 25 MG tablet Take 1 tablet (25 mg total) by mouth daily.   [DISCONTINUED] lisinopril (ZESTRIL) 10 MG tablet Take 1 tablet (10 mg total) by mouth daily.   No facility-administered medications prior to visit.     Objective:   BP 138/83 (BP Location: Right Arm, Patient Position: Sitting, Cuff Size: Normal)   Pulse 65   Temp 98.6 F (37 C) (Oral)   Ht 5\' 4"  (1.626 m)   Wt 254 lb (115.2 kg)   SpO2 100%   BMI 43.60 kg/m  Wt Readings from Last 3 Encounters:  01/26/21 254 lb (115.2 kg)  11/10/20 246 lb 6.4 oz (111.8 kg)  09/27/20 239 lb 6.4 oz (108.6 kg)    BP Readings from Last 3 Encounters:  01/26/21 138/83  11/10/20 (!) 151/94  09/27/20 (!) 155/97   General: well appearing, no distress Cardiac: RRR Pulm: lungs clear throughout MSK: trace to +1 lower extremity swelling to just above the ankle. Pain on palpation of the left ankle. Skin darkening present on the medial aspect on the left.    Health Maintenance:   Health Maintenance  Topic Date Due   COVID-19 Vaccine (1) Never done   MAMMOGRAM  Never done   Zoster Vaccines- Shingrix (1 of 2) Never done   INFLUENZA VACCINE  08/05/2021 (Originally 12/06/2020)   COLONOSCOPY (Pts 45-42yrs Insurance coverage will need to be confirmed)  04/21/2027    TETANUS/TDAP  11/11/2030   Hepatitis C Screening  Completed   HIV Screening  Completed   HPV VACCINES  Aged Out   PAP SMEAR-Modifier  Discontinued    Assessment & Plan:   Problem List Items Addressed This Visit       Cardiovascular and Mediastinum   Hypertension - Primary (Chronic)    Previously on hctz however this was switched to lisinopril back in July as she had been noncompliant with it, saying it made her swell up.  She had discontinued lisinopril at home due to concerns about it making her gain weight. Recently, she had her blood pressure checked and it was 160 over something. Due to this, she started taking her mother's telmisartan-hctz around 3-4 days ago. She had requested I fill this outside of an office visit but I asked her to come in to reassess her blood pressure and to obtain labs since non had been obtained since January of this year.  Blood pressure is reasonable in the office today.  BMP today is unremarkable Plan Start telmisartan-hctz 80-12.5mg  Lisinopril removed from med list Will have her return in 4w for repeat BMP  Follow up in January 2023 with me      Relevant Medications   telmisartan-hydrochlorothiazide (MICARDIS HCT) 80-12.5 MG tablet   Other Relevant Orders   Basic metabolic panel (  Completed)     Musculoskeletal and Integument   Achilles tendonitis, bilateral (Chronic)    This is a chronic issue. She was initially referred to podiatry in 2020 however does not appear to have done so. She was referred to them again in July of 2021 and was diagnosed with bilateral Achilles tendonitis and plantar fascitis. She was referred to PT. Her LOV with them was 01/2020 at which time they recommended follow up in 6w with consideration of an MRI if not improved. She did not follow up again after that visit.  She complains of persistent pain at today's visit and does not feel that it is being adequately managed. I agree with prior providers that opioid therapy should  try to be avoided.  Recommended following up with podiatry for re-evaluation of this        Other   History of recurrent deep vein thrombosis (DVT) (Chronic)    She had called last month with concerns of some mild gum bleeding with brushing her teeth. She reports this is an ongoing issue. She is concerned about this as well as a small bruise on her arm. Provided education on mild bleeding associated with anticoagulation and things that should raise her concern Reviewed the reasoning behind chronic anticoagulation and risk of discontinuing and she would like to continue it.  Continue xarelto      Major depressive disorder with current active episode (Chronic)    HPI: She has been taking zoloft 25mg  daily and endorses mild improvement in mood symptoms. She displayed signs of anxiety in the office today which she relates to her chronic lower extremity pain related to chronic achilles tendonitis. She expressed frustration at our clinic because she does not feel like we are adequately treating her pain and because she is seeing multiple providers here. I explained that unfortunately this is part of being seen in our residency clinic. I recommended that she seek out a regular clinic where she would have a single PCP, if she feels that this is largely impacting her health. She declined and says that she is planning on moving back to [name] in the near future once a job gets lined up. Assessment: I discussed the possibility of changing to duloxetine to help treat her chronic pain as well. She would like to think about this for now Plan Continue zoloft for now She will let us know if she would like to switch to duloxetine to assist with both mood and pain management. Follow up with Dr. Theodis Shove      Healthcare maintenance (Chronic)    Declines influenza vaccination today.      Other Visit Diagnoses     Encounter for screening mammogram for malignant neoplasm of breast       Relevant Orders    MM Digital Screening        Return in about 4 months (around 05/28/2021) for Follow up of chronic medical conditions with Dr. Darrick Meigs.   Pt discussed with Dr. Marty Heck, MD Internal Medicine Resident PGY-3 [facility] 01/28/2021 3:12 PM

## 2021-01-27 NOTE — Assessment & Plan Note (Addendum)
HPI: She has been taking zoloft 25mg  daily and endorses mild improvement in mood symptoms. She displayed signs of anxiety in the office today which she relates to her chronic lower extremity pain related to chronic achilles tendonitis. She expressed frustration at our clinic because she does not feel like we are adequately treating her pain and because she is seeing multiple providers here. I explained that unfortunately this is part of being seen in our residency clinic. I recommended that she seek out a regular clinic where she would have a single PCP, if she feels that this is largely impacting her health. She declined and says that she is planning on moving back to [name] in the near future once a job gets lined up. Assessment: I discussed the possibility of changing to duloxetine to help treat her chronic pain as well. She would like to think about this for now Plan  Continue zoloft for now  She will let us know if she would like to switch to duloxetine to assist with both mood and pain management.  Follow up with Dr. Theodis Shove

## 2021-01-27 NOTE — Assessment & Plan Note (Signed)
She had called last month with concerns of some mild gum bleeding with brushing her teeth. She reports this is an ongoing issue. She is concerned about this as well as a small bruise on her arm. Provided education on mild bleeding associated with anticoagulation and things that should raise her concern Reviewed the reasoning behind chronic anticoagulation and risk of discontinuing and she would like to continue it.   Continue xarelto

## 2021-01-27 NOTE — Assessment & Plan Note (Addendum)
Previously on hctz however this was switched to lisinopril back in July as she had been noncompliant with it, saying it made her swell up.  She had discontinued lisinopril at home due to concerns about it making her gain weight. Recently, she had her blood pressure checked and it was 160 over something. Due to this, she started taking her mother's telmisartan-hctz around 3-4 days ago. She had requested I fill this outside of an office visit but I asked her to come in to reassess her blood pressure and to obtain labs since non had been obtained since January of this year.  Blood pressure is reasonable in the office today.  BMP today is unremarkable Plan  Start telmisartan-hctz 80-12.5mg   Lisinopril removed from med list  Will have her return in 4w for repeat BMP   Follow up in January 2023 with me

## 2021-01-27 NOTE — Assessment & Plan Note (Signed)
Declines influenza vaccination today 

## 2021-01-28 MED ORDER — TELMISARTAN-HCTZ 80-12.5 MG PO TABS: 1.0000 | ORAL_TABLET | Freq: Every day | ORAL | 1 refills | Status: DC

## 2021-01-28 NOTE — Progress Notes (Signed)
Labs results reviewed with Kinzly on the phone. Telmesartan-hctz sent to pharmacy. She will call on Monday to let me know what she decides with duloxetine.

## 2021-01-31 ENCOUNTER — Encounter: Payer: 59 | Admitting: Internal Medicine

## 2021-02-01 ENCOUNTER — Telehealth: Payer: 59 | Admitting: Licensed Clinical Social Worker

## 2021-02-02 ENCOUNTER — Telehealth: Payer: Self-pay | Admitting: Licensed Clinical Social Worker

## 2021-02-02 NOTE — Telephone Encounter (Signed)
  Care Management   Follow Up Note   02/01/2021 Name: Lynn Pennington MRN: [id number] DOB: 08-24-1970   Referred by: Mitzi Hansen, MD Reason for referral : No chief complaint on file.   An unsuccessful telephone outreach was attempted today. The patient was referred to the case management team for assistance with care management and care coordination.   Follow Up Plan: The care management team will reach out to the patient again over the next 7 days.   Milus Height, [place]  Social Worker [facility] Care Management  [phone]

## 2021-02-06 NOTE — Telephone Encounter (Signed)
error 

## 2021-02-08 ENCOUNTER — Ambulatory Visit: Payer: 59 | Admitting: Licensed Clinical Social Worker

## 2021-02-08 NOTE — Patient Instructions (Signed)
Visit Information  Instructions: patient will work with SW to address concerns related to level of care.  Patient was given the following information about care management and care coordination services today, agreed to services, and gave verbal consent: 1.care management/care coordination services include personalized support from designated clinical staff supervised by their physician, including individualized plan of care and coordination with other care providers 2. 24/7 contact phone numbers for assistance for urgent and routine care needs. 3. The patient may stop care management/care coordination services at any time by phone call to the office staff.  Patient verbalizes understanding of instructions provided today and agrees to view in MyChart.   The care management team will reach out to the patient again over the next 30 days.   Kataleah Bejar, BSW  Social Worker [facility] Care Management  [phone]      

## 2021-02-08 NOTE — Chronic Care Management (AMB) (Signed)
  Care Management   Social Work Visit Note  02/08/2021 Name: Lynn Pennington MRN: [id number] DOB: 05-13-1970  Lynn Pennington is a 50 y.o. year old female who sees Mitzi Hansen, MD for primary care. The care management team was consulted for assistance with care management and care coordination needs related to [facility] Resources    Patient was given the following information about care management and care coordination services today, agreed to services, and gave verbal consent: 1.care management/care coordination services include personalized support from designated clinical staff supervised by their physician, including individualized plan of care and coordination with other care providers 2. 24/7 contact phone numbers for assistance for urgent and routine care needs. 3. The patient may stop care management/care coordination services at any time by phone call to the office staff.  Engaged with patient by telephone for follow up visit in response to provider referral for social work chronic care management and care coordination services.  Assessment: Review of patient history, allergies, and health status during evaluation of patient need for care management/care coordination services.    Interventions:  Patient interviewed and appropriate assessments performed Collaborated with clinical team regarding patient needs  Patient advised she is attempting to work at [employer], but gets confused on the job. Patient advised she may stop working.  Patient was given information regarding the servant center. Patient stated she would call to get assistance with her disability application.  Patient advised she had some question regarding he prescriptions. SW will inbasket the pharmacist.  Patient stated no other needs or resources needed at this time.   SDOH (Social Determinants of Health) assessments performed: Yes     Plan:  patient will work with BSW to address needs related to Level of  care concerns Social Worker will follow up in 30 day.   Milus Height, [place]  Social Worker [facility] Management  [phone]

## 2021-02-17 ENCOUNTER — Ambulatory Visit: Payer: 59 | Admitting: Behavioral Health

## 2021-02-17 ENCOUNTER — Telehealth: Payer: Self-pay | Admitting: Behavioral Health

## 2021-02-17 NOTE — Telephone Encounter (Signed)
Contacted Pt for 10:30am [facility] telehealth session today & reached her on first attempt. Spoke briefly & then call dropped. Attempted to call Pt again & could not lv msg. Pt will need to call & r/s prn as she has Hx of missing appts.  Dr. Theodis Shove

## 2021-02-21 ENCOUNTER — Telehealth: Payer: Self-pay | Admitting: Pharmacist

## 2021-02-21 ENCOUNTER — Telehealth: Payer: 59 | Admitting: Pharmacist

## 2021-02-21 NOTE — Telephone Encounter (Signed)
Attempted to call patient for telephone medication review appointment. Phone went to voicemail and unable to leave message.

## 2021-02-23 ENCOUNTER — Other Ambulatory Visit: Payer: Self-pay | Admitting: Internal Medicine

## 2021-02-23 DIAGNOSIS — Z1231 Encounter for screening mammogram for malignant neoplasm of breast: Secondary | ICD-10-CM

## 2021-03-10 ENCOUNTER — Telehealth: Payer: Self-pay | Admitting: Licensed Clinical Social Worker

## 2021-03-10 ENCOUNTER — Telehealth: Payer: 59 | Admitting: Licensed Clinical Social Worker

## 2021-03-10 NOTE — Telephone Encounter (Signed)
  Care Management   Follow Up Note   03/10/2021 Name: Lynn Pennington MRN: [id number] DOB: 06-15-1970   Referred by: Mitzi Hansen, MD Reason for referral : No chief complaint on file.   An unsuccessful telephone outreach was attempted today. The patient was referred to the case management team for assistance with care management and care coordination.   Follow Up Plan: The care management team will reach out to the patient again over the next 7 days.   Milus Height, [place]  Social Worker [facility] Care Management  [phone]

## 2021-03-28 ENCOUNTER — Telehealth: Payer: 59 | Admitting: Pharmacist

## 2021-04-11 ENCOUNTER — Telehealth: Payer: 59 | Admitting: Pharmacist

## 2021-04-11 ENCOUNTER — Telehealth: Payer: Self-pay | Admitting: Pharmacist

## 2021-04-11 NOTE — Telephone Encounter (Signed)
Attempted to call patient for telephone medication management appointment. Patient did not answer. Left HIPAA compliant voicemail.

## 2021-04-20 ENCOUNTER — Other Ambulatory Visit: Payer: Self-pay

## 2021-04-20 DIAGNOSIS — F331 Major depressive disorder, recurrent, moderate: Secondary | ICD-10-CM

## 2021-04-20 MED ORDER — SERTRALINE HCL 25 MG PO TABS: 25.0000 mg | ORAL_TABLET | Freq: Every day | ORAL | 3 refills | Status: DC

## 2021-06-07 ENCOUNTER — Ambulatory Visit: Payer: Medicaid Other | Admitting: Family Medicine

## 2021-06-07 NOTE — Progress Notes (Deleted)
° °  New Patient Office Visit  Subjective:  Patient ID: Lynn Pennington, female    DOB: Oct 05, 1970  Age: 51 y.o. MRN: [id number]  CC: No chief complaint on file.   HPI Lynn Pennington presents for new patient visit to establish care.  Introduced to Designer, jewellery role and practice setting.  All questions answered.  Discussed provider/patient relationship and expectations.  Past Medical History:  Diagnosis Date   Anxiety    Asthma    Chronic leg pain    Depression    DVT (deep venous thrombosis) ([place]) 2010, 2016   Hypertension    Iron deficiency     Past Surgical History:  Procedure Laterality Date   ABDOMINAL HYSTERECTOMY     FRACTURE SURGERY      Family History  Problem Relation Age of Onset   Colon cancer Neg Hx    Esophageal cancer Neg Hx    Rectal cancer Neg Hx    Stomach cancer Neg Hx     Social History   Socioeconomic History   Marital status: Single    Spouse name: Not on file   Number of children: Not on file   Years of education: Not on file   Highest education level: Not on file  Occupational History   Not on file  Tobacco Use   Smoking status: Former    Packs/day: 1.00    Types: Cigarettes    Quit date: 07/28/2020    Years since quitting: 0.8   Smokeless tobacco: Never   Tobacco comments:    stopped 2 months ago   Vaping Use   Vaping Use: Never used  Substance and Sexual Activity   Alcohol use: Not Currently   Drug use: Not Currently   Sexual activity: Yes  Other Topics Concern   Not on file  Social History Narrative   Not on file   Social Determinants of Health   Financial Resource Strain: Not on file  Food Insecurity: Food Insecurity Present   Worried About [place] in the Last Year: Sometimes true   Ran Out of Food in the Last Year: Sometimes true  Transportation Needs: Unmet Transportation Needs   Lack of Transportation (Medical): Yes   Lack of Transportation (Non-Medical): Yes  Physical Activity: Not on file  Stress:  Not on file  Social Connections: Not on file  Intimate Partner Violence: Not on file    ROS Review of Systems  Objective:   Today's Vitals: There were no vitals taken for this visit.  Physical Exam  Assessment & Plan:   Problem List Items Addressed This Visit   None   Outpatient Encounter Medications as of 06/08/2021  Medication Sig   albuterol (VENTOLIN HFA) 108 (90 Base) MCG/ACT inhaler Inhale 1-2 puffs into the lungs every 6 (six) hours as needed for wheezing or shortness of breath.   rivaroxaban (XARELTO) 10 MG TABS tablet TAKE 1 TABLET(10 MG) BY MOUTH DAILY   sertraline (ZOLOFT) 25 MG tablet Take 1 tablet (25 mg total) by mouth daily.   telmisartan-hydrochlorothiazide (MICARDIS HCT) 80-12.5 MG tablet Take 1 tablet by mouth daily.   No facility-administered encounter medications on file as of 06/08/2021.    Follow-up: No follow-ups on file.   Charyl Dancer, NP

## 2021-06-07 NOTE — Progress Notes (Deleted)
Medical Nutrition Therapy Patient has completed 2nd dose of COVID-19 vaccine. Appt start time: 1100 end time: 1200 (1 hour) Primary concerns today: Weight management and Blood pressure management .   Relevant history/background: Lynn Pennington has gained >20 lb in the past year, which she attributes mainly to starting sertraline.  She wants to learn how to best manage her diet to help with weight loss.  ***   Assessment:  Lynn Pennington ***  Learning Readiness: Ready  Weight: *** lb.  (Ht is 64".) Usual eating pattern: *** meals and *** snacks per day. Frequent foods and beverages: ***.   Avoided foods: ***.   Usual physical activity: ***. Sleep: ***  24-hr recall: (Up at  AM) B ( AM)-    Snk ( AM)-    L ( PM)-   Snk ( PM)-   D ( PM)-   Snk ( PM)-   Typical day? {yes no:314532}   Nutritional Diagnosis:  {CHL AMB NUTRITIONAL DIAGNOSIS:[id number]}  Handouts given during visit include: After-Visit Summary (AVS) ***  Demonstrated degree of understanding via:  Teach Back  Barriers to learning/adherence to lifestyle change: ***  Monitoring/Evaluation:  Dietary intake, exercise, and body weight {follow up:15908}.

## 2021-06-08 ENCOUNTER — Ambulatory Visit: Payer: Medicaid Other | Admitting: Nurse Practitioner

## 2021-06-08 DIAGNOSIS — Z7689 Persons encountering health services in other specified circumstances: Secondary | ICD-10-CM

## 2021-06-23 ENCOUNTER — Ambulatory Visit: Payer: Medicaid Other | Admitting: Family Medicine

## 2021-06-23 NOTE — Progress Notes (Unsigned)
Medical Nutrition Therapy Patient has completed 2nd dose of COVID-19 vaccine ***. Appt start time: 1430 end time: 1530 (1 hour) Primary concerns today: Weight management and BP management .   Relevant history/background: ***   Assessment:  ***  Learning Readiness:  Not ready Contemplating Ready Change in progress  Usual eating pattern: *** meals and *** snacks per day. Frequent foods and beverages: ***.   Avoided foods: ***.   Usual physical activity: ***. Sleep: ***  24-hr recall: (Up at  AM) B ( AM)-    Snk ( AM)-    L ( PM)-   Snk ( PM)-   D ( PM)-   Snk ( PM)-   Typical day? {yes no:314532}  Nutritional Diagnosis:  {CHL AMB NUTRITIONAL [phone]}  Handouts given during visit include: After-Visit Summary (AVS) ***  Demonstrated degree of understanding via:  Teach Back  Barriers to learning/adherence to lifestyle change: ***  Monitoring/Evaluation:  Dietary intake, exercise, ***, and body weight {follow up:15908}.

## 2021-07-28 ENCOUNTER — Other Ambulatory Visit: Payer: Self-pay | Admitting: *Deleted

## 2021-07-28 MED ORDER — RIVAROXABAN 10 MG PO TABS: ORAL_TABLET | ORAL | 1 refills | Status: DC

## 2021-09-03 ENCOUNTER — Other Ambulatory Visit: Payer: Self-pay

## 2021-09-03 ENCOUNTER — Encounter (HOSPITAL_COMMUNITY): Payer: Self-pay | Admitting: *Deleted

## 2021-09-03 ENCOUNTER — Emergency Department (HOSPITAL_COMMUNITY)
Admission: EM | Admit: 2021-09-03 | Discharge: 2021-09-03 | Disposition: A | Discharge status: Home / Self Care | Payer: 59 | Attending: Emergency Medicine | Admitting: Emergency Medicine

## 2021-09-03 ENCOUNTER — Emergency Department (HOSPITAL_BASED_OUTPATIENT_CLINIC_OR_DEPARTMENT_OTHER): Payer: 59

## 2021-09-03 DIAGNOSIS — M7989 Other specified soft tissue disorders: Secondary | ICD-10-CM

## 2021-09-03 DIAGNOSIS — Z7901 Long term (current) use of anticoagulants: Secondary | ICD-10-CM | POA: Insufficient documentation

## 2021-09-03 DIAGNOSIS — M79602 Pain in left arm: Secondary | ICD-10-CM | POA: Insufficient documentation

## 2021-09-03 DIAGNOSIS — I10 Essential (primary) hypertension: Secondary | ICD-10-CM | POA: Diagnosis not present

## 2021-09-03 LAB — BASIC METABOLIC PANEL
Anion gap: 6 (ref 5–15)
BUN: 10 mg/dL (ref 6–20)
CO2: 27 mmol/L (ref 22–32)
Calcium: 9.9 mg/dL (ref 8.9–10.3)
Chloride: 105 mmol/L (ref 98–111)
Creatinine, Ser: 0.84 mg/dL (ref 0.44–1.00)
GFR, Estimated: >60 mL/min (ref >60)
Glucose, Bld: 92 mg/dL (ref 70–99)
Potassium: 3.7 mmol/L (ref 3.5–5.1)
Sodium: 138 mmol/L (ref 135–145)

## 2021-09-03 LAB — CBC WITH DIFFERENTIAL/PLATELET
Abs Immature Granulocytes: 0.01 10*3/uL (ref 0.00–0.07)
Basophils Absolute: 0 10*3/uL (ref 0.0–0.1)
Basophils Relative: 1 %
Eosinophils Absolute: 0.1 10*3/uL (ref 0.0–0.5)
Eosinophils Relative: 2 %
HCT: 38.6 % (ref 36.0–46.0)
Hemoglobin: 12.8 g/dL (ref 12.0–15.0)
Immature Granulocytes: 0 %
Lymphocytes Relative: 44 %
Lymphs Abs: 2.8 10*3/uL (ref 0.7–4.0)
MCH: 31.8 pg (ref 26.0–34.0)
MCHC: 33.2 g/dL (ref 30.0–36.0)
MCV: 96 fL (ref 80.0–100.0)
Monocytes Absolute: 0.4 10*3/uL (ref 0.1–1.0)
Monocytes Relative: 6 %
Neutro Abs: 3 10*3/uL (ref 1.7–7.7)
Neutrophils Relative %: 47 %
Platelet Morphology: NORMAL
Platelets: 275 10*3/uL (ref 150–400)
RBC: 4.02 MIL/uL (ref 3.87–5.11)
RDW: 13.2 % (ref 11.5–15.5)
WBC: 6.3 10*3/uL (ref 4.0–10.5)
nRBC: 0 % (ref 0.0–0.2)

## 2021-09-03 NOTE — ED Provider Notes (Signed)
Care of patient assumed from Dr. Kellie Simmering at 3:30 PM.  She has a hx of DVTs.  She is on Xarelto, dose recently decreased 20 --> 10 Qd. Now has 5 days of L arm swelling. U/S pending.  ?Physical Exam  ?BP (!) 148/96 (BP Location: Right Arm)   Pulse 70   Temp 97.6 ?F (36.4 ?C) (Oral)   Resp 16   Ht '5\' 4"'$  (1.626 m)   Wt 113.9 kg   SpO2 100%   BMI 43.08 kg/m?  ? ?Physical Exam ?Vitals and nursing note reviewed.  ?Constitutional:   ?   General: She is not in acute distress. ?   Appearance: Normal appearance. She is well-developed. She is not ill-appearing, toxic-appearing or diaphoretic.  ?HENT:  ?   Head: Normocephalic and atraumatic.  ?   Right Ear: External ear normal.  ?   Left Ear: External ear normal.  ?   Nose: Nose normal.  ?Eyes:  ?   Extraocular Movements: Extraocular movements intact.  ?   Conjunctiva/sclera: Conjunctivae normal.  ?Cardiovascular:  ?   Rate and Rhythm: Normal rate and regular rhythm.  ?   Heart sounds: No murmur heard. ?Pulmonary:  ?   Effort: Pulmonary effort is normal. No respiratory distress.  ?Abdominal:  ?   General: There is no distension.  ?   Palpations: Abdomen is soft.  ?Musculoskeletal:     ?   General: No deformity. Normal range of motion.  ?   Cervical back: Normal range of motion and neck supple.  ?Skin: ?   General: Skin is warm and dry.  ?   Capillary Refill: Capillary refill takes less than 2 seconds.  ?Neurological:  ?   General: No focal deficit present.  ?   Mental Status: She is alert and oriented to person, place, and time.  ?Psychiatric:     ?   Mood and Affect: Mood normal.     ?   Behavior: Behavior normal.     ?   Thought Content: Thought content normal.     ?   Judgment: Judgment normal.  ? ? ?Procedures  ?Procedures ? ?ED Course / MDM  ?  ?Medical Decision Making ?Amount and/or Complexity of Data Reviewed ?Labs: ordered. ? ? ?Ultrasound study was negative for acute findings.  On assessment, patient is well-appearing.  She does feel comfortable with discharge  home following reassurance.  She was advised to continue her home medications as prescribed and to follow-up with her primary care doctor.  She was discharged in good condition. ? ? ? ? ?  ?Godfrey Pick, MD ?09/04/21 1430 ? ?

## 2021-09-03 NOTE — ED Triage Notes (Signed)
Left arm pain near elbow, x 5 days. has history of DVT in same arm, states she saw little bumbs then the feeling of nerve pain.  ?

## 2021-09-03 NOTE — ED Provider Notes (Signed)
?[facility] DEPT ?Provider Note ? ? ?CSN: [id number] ?Arrival date & time: 09/03/21  1354 ? ?  ? ?History ? ?Chief Complaint  ?Patient presents with  ? R/O DVT L Arm  ? ? ?Lynn Pennington is a 51 y.o. female. ? ?HPI ? ?51 year old female with medical history significant for depression, anxiety, HTN, DVT in the left upper arm and left lower extremity on Xarelto who presents emergency department with pain and swelling in her left arm.  The patient states that her Xarelto dose was recently cut back from 20 mg to 10 mg daily 2 months ago.  For the last 5 days she has had increasing pain and swelling in her left upper arm.  She also has noticed some bumps that appeared along her left forearm in a linear distribution. She does not work outside or in a garden. Not particularly itchy or painful. No redness. No fevers or chills. ? ?Home Medications ?Prior to Admission medications   ?Medication Sig Start Date End Date Taking? Authorizing Provider  ?albuterol (VENTOLIN HFA) 108 (90 Base) MCG/ACT inhaler Inhale 1-2 puffs into the lungs every 6 (six) hours as needed for wheezing or shortness of breath. 05/13/20   Vanessa Kick, MD  ?rivaroxaban (XARELTO) 10 MG TABS tablet TAKE 1 TABLET(10 MG) BY MOUTH DAILY 07/28/21   Mitzi Hansen, MD  ?sertraline (ZOLOFT) 25 MG tablet Take 1 tablet (25 mg total) by mouth daily. 04/20/21   Mitzi Hansen, MD  ?telmisartan-hydrochlorothiazide (MICARDIS HCT) 80-12.5 MG tablet Take 1 tablet by mouth daily. 01/28/21   Mitzi Hansen, MD  ?   ? ?Allergies    ?Patient has no known allergies.   ? ?Review of Systems   ?Review of Systems  ?All other systems reviewed and are negative. ? ?Physical Exam ?Updated Vital Signs ?BP (!) 148/96 (BP Location: Right Arm)   Pulse 70   Temp 97.6 ?F (36.4 ?C) (Oral)   Resp 16   Ht '5\' 4"'$  (1.626 m)   Wt 113.9 kg   SpO2 100%   BMI 43.08 kg/m?  ?Physical Exam ?Vitals and nursing note reviewed.  ?Constitutional:   ?   General: She is  not in acute distress. ?HENT:  ?   Head: Normocephalic and atraumatic.  ?Eyes:  ?   Conjunctiva/sclera: Conjunctivae normal.  ?   Pupils: Pupils are equal, round, and reactive to light.  ?Cardiovascular:  ?   Rate and Rhythm: Normal rate and regular rhythm.  ?Pulmonary:  ?   Effort: Pulmonary effort is normal. No respiratory distress.  ?Abdominal:  ?   General: There is no distension.  ?   Tenderness: There is no guarding.  ?Musculoskeletal:     ?   General: No deformity or signs of injury.  ?   Cervical back: Neck supple.  ?   Comments: Left arm with minimal swelling and tenderness, no significant erythema, 2+ radial pulses, intact motor function along the median, ulnar, radial nerve distributions.  ?Skin: ?   Findings: No lesion or rash.  ?Neurological:  ?   General: No focal deficit present.  ?   Mental Status: She is alert. Mental status is at baseline.  ? ? ?ED Results / Procedures / Treatments   ?Labs ?(all labs ordered are listed, but only abnormal results are displayed) ?Labs Reviewed  ?CBC WITH DIFFERENTIAL/PLATELET  ?BASIC METABOLIC PANEL  ? ? ?EKG ?None ? ?Radiology ?No results found. ? ?Procedures ?Procedures  ? ? ?Medications Ordered in ED ?Medications - No data  to display ? ?ED Course/ Medical Decision Making/ A&P ?  ?                        ?Medical Decision Making ?Amount and/or Complexity of Data Reviewed ?Labs: ordered. ? ? ?51 year old female with medical history significant for depression, anxiety, HTN, DVT in the left upper arm and left lower extremity on Xarelto who presents emergency department with pain and swelling in her left arm.  The patient states that her Xarelto dose was recently cut back from 20 mg to 10 mg daily 2 months ago.  For the last 5 days she has had increasing pain and swelling in her left upper arm.  She also has noticed some bumps that appeared along her left forearm in a linear distribution. She does not work outside or in a garden. Not particularly itchy or painful. No  redness. No fevers or chills. ? ?On arrival, the patient was vitally stable.  Left arm neurovascularly intact.  No recent falls or trauma.  Patient is currently anticoagulated but her Xarelto dose was recently reduced.  Will obtain DVT ultrasound of the left upper extremity to evaluate for breakthrough DVT on the patient's reduced dose of Xarelto.  Screening laboratory work-up initiated. ? ?Plan at time of signout to follow-up labs and imaging, reassess the patient and disposition appropriately.  Signout given to Dr. Doren Custard at 1500. ? ?Final Clinical Impression(s) / ED Diagnoses ?Final diagnoses:  ?Left arm pain  ? ? ?Rx / DC Orders ?ED Discharge Orders   ? ? None  ? ?  ? ? ?  ?Regan Lemming, MD ?09/03/21 1447 ? ?

## 2021-09-03 NOTE — Progress Notes (Signed)
VASCULAR LAB ? ? ? ?Left upper extremity venous duplex has been performed. ? ?See CV proc for preliminary results. ? ? ?Ura Yingling, RVT ?09/03/2021, 5:17 PM ? ?

## 2021-09-03 NOTE — Discharge Instructions (Signed)
Continue taking home medications as prescribed. ?
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
## 2022-03-14 LAB — CBC AND DIFFERENTIAL
HCT: 39 (ref 36–46)
Hemoglobin: 12.3 (ref 12.0–16.0)
Platelets: 287 10*3/uL (ref 150–400)
WBC: 6.3

## 2022-03-14 LAB — HEPATIC FUNCTION PANEL
ALT: 17 U/L (ref 7–35)
AST: 17 (ref 13–35)
Alkaline Phosphatase: 53 (ref 25–125)
Bilirubin, Total: 0.3

## 2022-03-14 LAB — CBC: RBC: 3.95 (ref 3.87–5.11)

## 2022-03-14 LAB — BASIC METABOLIC PANEL
CO2: 29 — AB (ref 13–22)
Chloride: 105 (ref 99–108)
Glucose: 125
Potassium: 3.9 mEq/L (ref 3.5–5.1)
Sodium: 144 (ref 137–147)

## 2022-03-14 LAB — COMPREHENSIVE METABOLIC PANEL
Albumin: 3.8 (ref 3.5–5.0)
eGFR: 87

## 2022-03-14 LAB — HEMOGLOBIN A1C: Hemoglobin A1C: 5.6

## 2022-04-27 LAB — HM MAMMOGRAPHY

## 2022-07-31 ENCOUNTER — Encounter: Payer: Self-pay | Admitting: Nurse Practitioner

## 2022-07-31 ENCOUNTER — Ambulatory Visit (INDEPENDENT_AMBULATORY_CARE_PROVIDER_SITE_OTHER): Payer: Medicaid Other | Admitting: Nurse Practitioner

## 2022-07-31 VITALS — BP 132/84 | HR 88 | Temp 98.6°F | Ht 64.0 in | Wt 259.8 lb

## 2022-07-31 DIAGNOSIS — E559 Vitamin D deficiency, unspecified: Secondary | ICD-10-CM | POA: Diagnosis not present

## 2022-07-31 DIAGNOSIS — J4520 Mild intermittent asthma, uncomplicated: Secondary | ICD-10-CM

## 2022-07-31 DIAGNOSIS — Z86718 Personal history of other venous thrombosis and embolism: Secondary | ICD-10-CM

## 2022-07-31 DIAGNOSIS — R110 Nausea: Secondary | ICD-10-CM

## 2022-07-31 DIAGNOSIS — I10 Essential (primary) hypertension: Secondary | ICD-10-CM

## 2022-07-31 LAB — CBC
HCT: 37.7 % (ref 36.0–46.0)
Hemoglobin: 12.2 g/dL (ref 12.0–15.0)
MCHC: 32.4 g/dL (ref 30.0–36.0)
MCV: 93.5 fl (ref 78.0–100.0)
Platelets: 321 10*3/uL (ref 150.0–400.0)
RBC: 4.04 Mil/uL (ref 3.87–5.11)
RDW: 13.9 % (ref 11.5–15.5)
WBC: 8.8 10*3/uL (ref 4.0–10.5)

## 2022-07-31 LAB — COMPREHENSIVE METABOLIC PANEL
ALT: 15 U/L (ref 0–35)
AST: 15 U/L (ref 0–37)
Albumin: 3.9 g/dL (ref 3.5–5.2)
Alkaline Phosphatase: 47 U/L (ref 39–117)
BUN: 9 mg/dL (ref 6–23)
CO2: 28 mEq/L (ref 19–32)
Calcium: 9.4 mg/dL (ref 8.4–10.5)
Chloride: 104 mEq/L (ref 96–112)
Creatinine, Ser: 0.82 mg/dL (ref 0.40–1.20)
GFR: 81.24 mL/min (ref >60.00)
Glucose, Bld: 89 mg/dL (ref 70–99)
Potassium: 4.2 mEq/L (ref 3.5–5.1)
Sodium: 139 mEq/L (ref 135–145)
Total Bilirubin: 0.2 mg/dL (ref 0.2–1.2)
Total Protein: 6.8 g/dL (ref 6.0–8.3)

## 2022-07-31 LAB — HEMOGLOBIN A1C: Hgb A1c MFr Bld: 6 % (ref 4.6–6.5)

## 2022-07-31 LAB — LIPID PANEL
Cholesterol: 167 mg/dL (ref 0–200)
HDL: 45.4 mg/dL (ref >39.00)
LDL Cholesterol: 102 mg/dL — ABNORMAL HIGH (ref 0–99)
NonHDL: 121.54
Total CHOL/HDL Ratio: 4
Triglycerides: 96 mg/dL (ref 0.0–149.0)
VLDL: 19.2 mg/dL (ref 0.0–40.0)

## 2022-07-31 MED ORDER — RIVAROXABAN 20 MG PO TABS: 20.0000 mg | ORAL_TABLET | Freq: Every day | ORAL | 1 refills | Status: DC

## 2022-07-31 MED ORDER — ALBUTEROL SULFATE HFA 108 (90 BASE) MCG/ACT IN AERS: 1.0000 | INHALATION_SPRAY | Freq: Four times a day (QID) | RESPIRATORY_TRACT | 1 refills | Status: DC | PRN

## 2022-07-31 MED ORDER — PHENTERMINE HCL 37.5 MG PO CAPS: 37.5000 mg | ORAL_CAPSULE | ORAL | 0 refills | Status: DC

## 2022-07-31 MED ORDER — EPINEPHRINE 0.3 MG/0.3ML IJ SOAJ: 0.3000 mg | INTRAMUSCULAR | 1 refills | Status: AC | PRN

## 2022-07-31 NOTE — Patient Instructions (Signed)
It was great to see you!  We are checking your labs today and will let you know the results via mychart/phone.   Start phentermine 1 capsule daily  I have refilled your inhaler and epi pen.  Let's follow-up in 4 weeks, sooner if you have concerns.  If a referral was placed today, you will be contacted for an appointment. Please note that routine referrals can sometimes take up to 3-4 weeks to process. Please call our office if you haven't heard anything after this time frame.  Take care,  Vance Peper, NP
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
## 2022-08-01 ENCOUNTER — Telehealth: Payer: Self-pay | Admitting: Nurse Practitioner

## 2022-08-01 ENCOUNTER — Encounter: Payer: Self-pay | Admitting: Nurse Practitioner

## 2022-08-01 DIAGNOSIS — E559 Vitamin D deficiency, unspecified: Secondary | ICD-10-CM | POA: Insufficient documentation

## 2022-08-01 DIAGNOSIS — R110 Nausea: Secondary | ICD-10-CM | POA: Insufficient documentation

## 2022-08-01 LAB — INTERPRETATION:

## 2022-08-01 LAB — FOOD ALLERGY PROFILE
Allergen, Salmon, f41: <0.1 kU/L
Almonds: <0.1 kU/L
CLASS: 0
CLASS: 0
CLASS: 0
CLASS: 0
CLASS: 0
CLASS: 0
CLASS: 0
CLASS: 1
CLASS: 2
Cashew IgE: <0.1 kU/L
Class: 0
Class: 0
Class: 1
Egg White IgE: <0.1 kU/L
Fish Cod: <0.1 kU/L
Hazelnut: 0.44 kU/L — ABNORMAL HIGH
Milk IgE: 0.13 kU/L — ABNORMAL HIGH
Peanut IgE: 0.6 kU/L — ABNORMAL HIGH
Scallop IgE: <0.1 kU/L
Sesame Seed f10: 0.25 kU/L — ABNORMAL HIGH
Shrimp IgE: <0.1 kU/L
Soybean IgE: 0.28 kU/L — ABNORMAL HIGH
Tuna IgE: <0.1 kU/L
Walnut: 2.19 kU/L — ABNORMAL HIGH
Wheat IgE: <0.1 kU/L

## 2022-08-01 LAB — VITAMIN D 25 HYDROXY (VIT D DEFICIENCY, FRACTURES): VITD: 21.86 ng/mL — ABNORMAL LOW (ref 30.00–100.00)

## 2022-08-01 MED ORDER — AZITHROMYCIN 250 MG PO TABS: ORAL_TABLET | ORAL | 0 refills | Status: AC

## 2022-08-01 NOTE — Assessment & Plan Note (Signed)
She endorses nausea intermittently after eating. Will check a food allergy profile. Recommend that she keep a food journal to help identify foods that are triggering symptoms.

## 2022-08-01 NOTE — Assessment & Plan Note (Signed)
Chronic, stable. BP currently 132/84 and is controlled with diet and exercise. Keep checking blood pressure at home. Check CMP, CBC, lipid panel today. Follow-up in 4 weeks.

## 2022-08-01 NOTE — Assessment & Plan Note (Signed)
She has a history of multiple DVTs. Will have her continue xarelto 20mg  daily.

## 2022-08-01 NOTE — Telephone Encounter (Signed)
Caller Name: Caoimhe Call back phone #: [phone]  Reason for Call: Pt feels like she is having an allergic reaction to a med she is taking. She is going to hold off taking any until she speaks with you. She has hives indifferent places on her body.

## 2022-08-01 NOTE — Assessment & Plan Note (Addendum)
BMI 44.5. She has been trying to eat smaller portion sizes and increase exercise. She has taken phentermine in the past and would like to re-start this. PDMP reviewed. Will have her start phentermine 37.5mg  daily. Keep checking blood pressure at home and call if it goes above 150/90. Check A1c today. Follow-up in 4 weeks.

## 2022-08-01 NOTE — Telephone Encounter (Signed)
Patient woke up this  morning with itching and hives and knots from the hives now. Patient believes there is one medicine that she is not supposed to take with blood thinners. The only thing different is she took the PCN and the blood thinner the same day.

## 2022-08-01 NOTE — Assessment & Plan Note (Signed)
Chronic, stable. She rarely uses an albuterol inhaler, will refill today. Follow-up in 6 months or sooner with concerns.

## 2022-08-01 NOTE — Telephone Encounter (Signed)
Called and spoke with patient. She states that she started getting hives last night. The only new medication she is taking is penicillin for recent diagnosis of strep throat. She has only been taking 1 tablet a day instead of twice a day and has been taking them for almost 10 days. She states the hives have turned into welts. She stopped taking all of her medications after she got the hives. She denies shortness of breath and mouth/tongue swelling. Penicillin discontinued. Start azithromycin 500mg  day 1, then 250mg  daily for 4 days. She can take benadryl and/or claritin for the hives and itching. Recommend she schedule an appointment or go to the ER/Urgent care if symptoms worsen overnight. She can take her xarelto tonight. Penicillin added as allergy.

## 2022-08-01 NOTE — Assessment & Plan Note (Signed)
She just finished a weekly vitamin D 50,000 unit prescription. Will check vitamin D levels and adjust regimen based on results.

## 2022-08-02 ENCOUNTER — Encounter: Payer: Self-pay | Admitting: Nurse Practitioner

## 2022-08-02 NOTE — Telephone Encounter (Signed)
[employer] [employer] portal report sent on 3/27 at 11:10 a.m.

## 2022-08-31 ENCOUNTER — Ambulatory Visit: Payer: Medicaid Other | Admitting: Nurse Practitioner

## 2022-09-04 ENCOUNTER — Ambulatory Visit: Payer: Medicaid Other | Admitting: Nurse Practitioner

## 2022-09-04 ENCOUNTER — Telehealth: Payer: Self-pay | Admitting: Nurse Practitioner

## 2022-09-04 NOTE — Telephone Encounter (Signed)
NS 4/29 no reason available letter sent

## 2022-09-26 NOTE — Telephone Encounter (Signed)
1st no show w/in 12 months, fee cannot be billed [facility] plan), letter sent via mail, text sent

## 2022-09-26 NOTE — Telephone Encounter (Signed)
Noted  

## 2022-10-12 ENCOUNTER — Telehealth: Payer: Self-pay | Admitting: Nurse Practitioner

## 2022-10-12 NOTE — Telephone Encounter (Signed)
Pt said she need her blood thinner prescription put in. I asked the patient the name but she was not sure of the name

## 2022-10-12 NOTE — Telephone Encounter (Signed)
LVM to call back. I wanted to verify the Rx name that she wanted refilled

## 2022-10-13 MED ORDER — RIVAROXABAN 20 MG PO TABS: 20.0000 mg | ORAL_TABLET | Freq: Every day | ORAL | 1 refills | Status: DC

## 2022-10-13 NOTE — Telephone Encounter (Signed)
LVM that Rx approved and sent to pharmacy. 

## 2022-10-13 NOTE — Telephone Encounter (Signed)
Requesting: Xarelto Last Visit: 07/31/2022 Next Visit: 10/16/2022 Last Refill: 07/31/2022  Please Advise

## 2022-10-16 ENCOUNTER — Ambulatory Visit: Payer: Medicaid Other | Admitting: Nurse Practitioner

## 2022-10-16 ENCOUNTER — Telehealth: Payer: Self-pay | Admitting: Nurse Practitioner

## 2022-10-16 NOTE — Telephone Encounter (Signed)
Pt NS on 6/10 no reason available letter sent. 

## 2022-10-17 NOTE — Telephone Encounter (Signed)
2nd no show, cannot bill fee/Medicaid plan, final warning sent via mail, text sent

## 2022-10-18 NOTE — Telephone Encounter (Signed)
Noted  

## 2023-04-19 ENCOUNTER — Other Ambulatory Visit: Payer: Self-pay | Admitting: Nurse Practitioner

## 2023-04-19 NOTE — Telephone Encounter (Signed)
Requesting:  XARELTO 20MG  TABLETS  Last Visit: 07/31/2022 Next Visit: Visit date not found Last Refill: 10/13/2022  Please Advise

## 2023-04-25 NOTE — Telephone Encounter (Signed)
Copied from CRM [id number]. Topic: General - Other >> Apr 25, 2023  3:01 PM Prudencio Pair wrote: Reason for CRM: Patient is returning call in regards to medication questions and concerns. Please contact patient back. CB #: [phone].  Return patient phone call 2nd attempt to contact; no answer and was unable to leave VM due to mailbox being full. Patient need to schedule follow up appointment with Leotis Shames NP.

## 2023-04-25 NOTE — Telephone Encounter (Signed)
Copied from CRM [id number]. Topic: Clinical - Medication Question >> Apr 25, 2023  1:25 PM Kathryne Eriksson wrote: Patient wants to know why doctor only prescribed a month supply, instead of a 30 day supply.  Called patient to go over medication questions and concerns; I was unable to leave VM due to mailbox being full.

## 2023-05-22 ENCOUNTER — Other Ambulatory Visit: Payer: Self-pay | Admitting: Nurse Practitioner

## 2023-05-22 NOTE — Telephone Encounter (Signed)
 Requesting: XARELTO 20MG  TABLETS  Last Visit: 07/31/2022 Next Visit: Visit date not found Last Refill: 04/19/2023  Please Advise

## 2023-07-26 ENCOUNTER — Ambulatory Visit (INDEPENDENT_AMBULATORY_CARE_PROVIDER_SITE_OTHER): Payer: Medicaid Other | Admitting: Nurse Practitioner

## 2023-07-26 ENCOUNTER — Telehealth: Payer: Self-pay | Admitting: Nurse Practitioner

## 2023-07-26 ENCOUNTER — Encounter: Payer: Self-pay | Admitting: Nurse Practitioner

## 2023-07-26 VITALS — BP 134/80 | HR 62 | Temp 97.3°F | Ht 64.0 in | Wt 270.6 lb

## 2023-07-26 DIAGNOSIS — Z0000 Encounter for general adult medical examination without abnormal findings: Secondary | ICD-10-CM | POA: Insufficient documentation

## 2023-07-26 DIAGNOSIS — Z23 Encounter for immunization: Secondary | ICD-10-CM | POA: Diagnosis not present

## 2023-07-26 DIAGNOSIS — E559 Vitamin D deficiency, unspecified: Secondary | ICD-10-CM | POA: Diagnosis not present

## 2023-07-26 DIAGNOSIS — Z86718 Personal history of other venous thrombosis and embolism: Secondary | ICD-10-CM

## 2023-07-26 DIAGNOSIS — L259 Unspecified contact dermatitis, unspecified cause: Secondary | ICD-10-CM

## 2023-07-26 DIAGNOSIS — J4520 Mild intermittent asthma, uncomplicated: Secondary | ICD-10-CM | POA: Diagnosis not present

## 2023-07-26 DIAGNOSIS — Z1231 Encounter for screening mammogram for malignant neoplasm of breast: Secondary | ICD-10-CM

## 2023-07-26 DIAGNOSIS — I10 Essential (primary) hypertension: Secondary | ICD-10-CM | POA: Diagnosis not present

## 2023-07-26 DIAGNOSIS — Z131 Encounter for screening for diabetes mellitus: Secondary | ICD-10-CM

## 2023-07-26 DIAGNOSIS — Z5982 Transportation insecurity: Secondary | ICD-10-CM

## 2023-07-26 MED ORDER — RIVAROXABAN 20 MG PO TABS: 20.0000 mg | ORAL_TABLET | Freq: Every day | ORAL | 1 refills | Status: AC

## 2023-07-26 MED ORDER — ALBUTEROL SULFATE HFA 108 (90 BASE) MCG/ACT IN AERS: 1.0000 | INHALATION_SPRAY | Freq: Four times a day (QID) | RESPIRATORY_TRACT | 1 refills | Status: AC | PRN

## 2023-07-26 MED ORDER — WEGOVY 0.25 MG/0.5ML ~~LOC~~ SOAJ: 0.2500 mg | SUBCUTANEOUS | 0 refills | Status: DC

## 2023-07-26 NOTE — Assessment & Plan Note (Signed)
Health maintenance reviewed and updated. Discussed nutrition, exercise. Follow-up 1 year.

## 2023-07-26 NOTE — Progress Notes (Signed)
 BP 134/80 (BP Location: Right Arm, Patient Position: Sitting)   Pulse 62   Temp (!) 97.3 F (36.3 C)   Ht 5\' 4"  (1.626 m)   Wt 270 lb 9.6 oz (122.7 kg)   SpO2 98%   BMI 46.45 kg/m    Subjective:    Patient ID: Lynn Pennington, female    DOB: 01-14-1971, 53 y.o.   MRN: [id number]  CC: Chief Complaint  Patient presents with   Annual Exam    With fasting lab work, concerns with body rash     HPI: Lynn Pennington is a 53 y.o. female presenting on 07/26/2023 for comprehensive medical examination. Current medical complaints include: Rash  She has been having a recurrent rash on her buttocks. It happens about every 6 months. She states that it does itch, denies pain. She puts A&D ointment or diaper rash cream. It then will go away. She does have it today, it started about 4 days ago.   She currently lives with: alone Menopausal Symptoms: no  Depression and Anxiety Screen done today and results listed below:     07/26/2023    2:34 PM 07/31/2022    2:16 PM 01/26/2021    1:58 PM 11/10/2020    4:24 PM 09/27/2020    5:22 PM  Depression screen PHQ 2/9  Decreased Interest 0 3 0 0 0  Down, Depressed, Hopeless 0 1 0 0 0  PHQ - 2 Score 0 4 0 0 0  Altered sleeping 0 2 0  0  Tired, decreased energy 2 2 2  2   Change in appetite 2 3 2   0  Feeling bad or failure about yourself  0 0 0  2  Trouble concentrating 0 2 1  0  Moving slowly or fidgety/restless 0 0 1  0  Suicidal thoughts 0 0 0  0  PHQ-9 Score 4 13 6  4   Difficult doing work/chores  Not difficult at all Very difficult        07/26/2023    2:34 PM 07/31/2022    2:16 PM 11/13/2019    4:55 PM  GAD 7 : Generalized Anxiety Score  Nervous, Anxious, on Edge 2 2 3   Control/stop worrying 2 1 3   Worry too much - different things 2 1 3   Trouble relaxing 2 3 3   Restless 0 2 3  Easily annoyed or irritable 2 1 3   Afraid - awful might happen 3 2 0  Total GAD 7 Score 13 12 18   Anxiety Difficulty   Somewhat difficult    The patient does not  have a history of falls. I did not complete a risk assessment for falls. A plan of care for falls was not documented.   Past Medical History:  Past Medical History:  Diagnosis Date   Anxiety    Asthma    Chronic leg pain    Depression    DVT (deep venous thrombosis) (HCC) 2010, 2016   History of femur fracture    left   Hypertension    Iron deficiency    Major depressive disorder with current active episode 11/13/2019   Patient reported history of depression that was treated with Sertraline.     Surgical History:  Past Surgical History:  Procedure Laterality Date   ABDOMINAL HYSTERECTOMY     FRACTURE SURGERY Left    femur    Medications:  Current Outpatient Medications on File Prior to Visit  Medication Sig   EPINEPHrine 0.3 mg/0.3 mL IJ SOAJ  injection Inject 0.3 mg into the muscle as needed for anaphylaxis. (Patient not taking: Reported on 07/26/2023)   No current facility-administered medications on file prior to visit.    Allergies:  Allergies  Allergen Reactions   Bee Venom Anaphylaxis   Penicillins Hives    Social History:  Social History   Socioeconomic History   Marital status: Single    Spouse name: Not on file   Number of children: 2   Years of education: Not on file   Highest education level: Not on file  Occupational History   Not on file  Tobacco Use   Smoking status: Former    Current packs/day: 0.00    Types: Cigarettes    Quit date: 07/28/2020    Years since quitting: 2.9   Smokeless tobacco: Never   Tobacco comments:    stopped 2 months ago   Vaping Use   Vaping status: Every Day   Substances: Nicotine  Substance and Sexual Activity   Alcohol use: Not Currently   Drug use: Not Currently   Sexual activity: Yes    Birth control/protection: Surgical  Other Topics Concern   Not on file  Social History Narrative   Not on file   Social Drivers of Health   Financial Resource Strain: High Risk (01/20/2020)   Overall Financial Resource  Strain (CARDIA)    Difficulty of Paying Living Expenses: Very hard  Food Insecurity: Food Insecurity Present (12/27/2020)   Hunger Vital Sign    Worried About Running Out of Food in the Last Year: Sometimes true    Ran Out of Food in the Last Year: Sometimes true  Transportation Needs: Unmet Transportation Needs (12/27/2020)   PRAPARE - Administrator, Civil Service (Medical): Yes    Lack of Transportation (Non-Medical): Yes  Physical Activity: Not on file  Stress: Not on file  Social Connections: Not on file  Intimate Partner Violence: Not on file   Social History   Tobacco Use  Smoking Status Former   Current packs/day: 0.00   Types: Cigarettes   Quit date: 07/28/2020   Years since quitting: 2.9  Smokeless Tobacco Never  Tobacco Comments   stopped 2 months ago    Social History   Substance and Sexual Activity  Alcohol Use Not Currently    Family History:  Family History  Problem Relation Age of Onset   Hypertension Mother    Hypertension Sister    Diabetes Maternal Aunt    Diabetes Maternal Uncle    Cancer Maternal Grandmother        breast   Colon cancer Neg Hx    Esophageal cancer Neg Hx    Rectal cancer Neg Hx    Stomach cancer Neg Hx     Past medical history, surgical history, medications, allergies, family history and social history reviewed with patient today and changes made to appropriate areas of the chart.   Review of Systems  Constitutional: Negative.   HENT: Negative.    Eyes: Negative.   Respiratory: Negative.    Cardiovascular: Negative.   Gastrointestinal: Negative.   Genitourinary: Negative.   Musculoskeletal: Negative.   Skin:  Positive for rash.  Neurological: Negative.   Psychiatric/Behavioral: Negative.     All other ROS negative except what is listed above and in the HPI.      Objective:    BP 134/80 (BP Location: Right Arm, Patient Position: Sitting)   Pulse 62   Temp (!) 97.3 F (36.3 C)  Ht 5\' 4"  (1.626 m)    Wt 270 lb 9.6 oz (122.7 kg)   SpO2 98%   BMI 46.45 kg/m   Wt Readings from Last 3 Encounters:  07/26/23 270 lb 9.6 oz (122.7 kg)  07/31/22 259 lb 12.8 oz (117.8 kg)  09/03/21 251 lb (113.9 kg)    Physical Exam Vitals and nursing note reviewed.  Constitutional:      General: She is not in acute distress.    Appearance: Normal appearance. She is obese.  HENT:     Head: Normocephalic and atraumatic.     Right Ear: Tympanic membrane, ear canal and external ear normal.     Left Ear: Tympanic membrane, ear canal and external ear normal.     Mouth/Throat:     Mouth: Mucous membranes are moist.     Pharynx: No posterior oropharyngeal erythema.  Eyes:     Conjunctiva/sclera: Conjunctivae normal.  Cardiovascular:     Rate and Rhythm: Normal rate and regular rhythm.     Pulses: Normal pulses.     Heart sounds: Normal heart sounds.  Pulmonary:     Effort: Pulmonary effort is normal.     Breath sounds: Normal breath sounds.  Abdominal:     Palpations: Abdomen is soft.     Tenderness: There is no abdominal tenderness.  Musculoskeletal:        General: Normal range of motion.     Cervical back: Normal range of motion and neck supple.     Right lower leg: No edema.     Left lower leg: No edema.  Lymphadenopathy:     Cervical: No cervical adenopathy.  Skin:    General: Skin is warm and dry.     Findings: Rash (dry, scaly rash to buttocks) present.  Neurological:     General: No focal deficit present.     Mental Status: She is alert and oriented to person, place, and time.     Cranial Nerves: No cranial nerve deficit.     Coordination: Coordination normal.     Gait: Gait normal.  Psychiatric:        Mood and Affect: Mood normal.        Behavior: Behavior normal.        Thought Content: Thought content normal.        Judgment: Judgment normal.     Results for orders placed or performed in visit on 08/02/22  CBC and differential   Collection Time: 03/14/22 12:00 AM  Result  Value Ref Range   Hemoglobin 12.3 12.0 - 16.0   HCT 39 36 - 46   Platelets 287 150 - 400 K/uL   WBC 6.3   CBC   Collection Time: 03/14/22 12:00 AM  Result Value Ref Range   RBC 3.95 3.87 - 5.11  Basic metabolic panel   Collection Time: 03/14/22 12:00 AM  Result Value Ref Range   Glucose 125    CO2 29 (A) 13 - 22   Potassium 3.9 3.5 - 5.1 mEq/L   Sodium 144 137 - 147   Chloride 105 99 - 108  Comprehensive metabolic panel   Collection Time: 03/14/22 12:00 AM  Result Value Ref Range   eGFR 87    Albumin 3.8 3.5 - 5.0  Hepatic function panel   Collection Time: 03/14/22 12:00 AM  Result Value Ref Range   Alkaline Phosphatase 53 25 - 125   ALT 17 7 - 35 U/L   AST 17 13 - 35  Bilirubin, Total 0.3   Hemoglobin A1c   Collection Time: 03/14/22 12:00 AM  Result Value Ref Range   Hemoglobin A1C 5.6       Assessment & Plan:   Problem List Items Addressed This Visit       Cardiovascular and Mediastinum   Hypertension (Chronic)   Chronic, stable. BP currently 132/84 and is controlled with diet and exercise. Keep checking blood pressure at home. Check CMP, CBC, lipid panel today.       Relevant Medications   rivaroxaban (XARELTO) 20 MG TABS tablet   Other Relevant Orders   CBC with Differential/Platelet   Comprehensive metabolic panel   Lipid panel     Respiratory   Asthma   Chronic, stable. She rarely uses an albuterol inhaler, will refill today. Follow-up in 6 months or sooner with concerns. Declines pneumonia vaccine today.       Relevant Medications   albuterol (VENTOLIN HFA) 108 (90 Base) MCG/ACT inhaler     Other   History of recurrent deep vein thrombosis (DVT) (Chronic)   She has a history of multiple DVTs. Will have her continue xarelto 20mg  daily.       Morbid obesity (HCC)   BMI 46.4. She did not tolerate phentermine as it caused headache. Discussed nutrition and exercise. Recommend eating 1500-1800 calories per day and getting 150 minutes of exercise per  week. Will start wegovy 0.25mg  injection weekly. Discussed possible side effects. Follow-up in 6-8 weeks.       Relevant Medications   Semaglutide-Weight Management (WEGOVY) 0.25 MG/0.5ML SOAJ   Other Relevant Orders   Hemoglobin A1c   Vitamin D deficiency   Check vitamin D levels today and treat based on results.       Relevant Orders   VITAMIN D 25 Hydroxy (Vit-D Deficiency, Fractures)   Routine general medical examination at a health care facility - Primary   Health maintenance reviewed and updated. Discussed nutrition, exercise. Follow-up 1 year.        Other Visit Diagnoses       Contact dermatitis, unspecified contact dermatitis type, unspecified trigger       Recommend using sensitive, non-scented soap and laundry detergent. Continue A&D ointment. F/U if not improving.     Encounter for screening mammogram for malignant neoplasm of breast       Mammogram ordered today   Relevant Orders   MM 3D SCREENING MAMMOGRAM BILATERAL BREAST     Screening for diabetes mellitus       Screen A1c today with obesity.   Relevant Orders   Hemoglobin A1c     Immunization due       Shingrix #1 given today   Relevant Orders   Varicella-zoster vaccine IM        Follow up plan: Return in about 6 weeks (around 09/06/2023) for 6-8 weeks weight management .   LABORATORY TESTING:  - Pap smear: not applicable  IMMUNIZATIONS:   - Tdap: Tetanus vaccination status reviewed: last tetanus booster within 10 years. - Influenza: Declined - Pneumovax: Not applicable - Prevnar: Declined - HPV: Not applicable - Shingrix vaccine: Administered today  SCREENING: -Mammogram: Ordered today  - Colonoscopy: Up to date  - Bone Density: Not applicable   PATIENT COUNSELING:   Advised to take 1 mg of folate supplement per day if capable of pregnancy.   Sexuality: Discussed sexually transmitted diseases, partner selection, use of condoms, avoidance of unintended pregnancy  and contraceptive  alternatives.   Advised to avoid cigarette  smoking.  I discussed with the patient that most people either abstain from alcohol or drink within safe limits (<=14/week and <=4 drinks/occasion for males, <=7/weeks and <= 3 drinks/occasion for females) and that the risk for alcohol disorders and other health effects rises proportionally with the number of drinks per week and how often a drinker exceeds daily limits.  Discussed cessation/primary prevention of drug use and availability of treatment for abuse.   Diet: Encouraged to adjust caloric intake to maintain  or achieve ideal body weight, to reduce intake of dietary saturated fat and total fat, to limit sodium intake by avoiding high sodium foods and not adding table salt, and to maintain adequate dietary potassium and calcium preferably from fresh fruits, vegetables, and low-fat dairy products.    stressed the importance of regular exercise  Injury prevention: Discussed safety belts, safety helmets, smoke detector, smoking near bedding or upholstery.   Dental health: Discussed importance of regular tooth brushing, flossing, and dental visits.    NEXT PREVENTATIVE PHYSICAL DUE IN 1 YEAR. Return in about 6 weeks (around 09/06/2023) for 6-8 weeks weight management .  Skyah Hannon A Rayaan Garguilo

## 2023-07-26 NOTE — Assessment & Plan Note (Signed)
 Chronic, stable. She rarely uses an albuterol inhaler, will refill today. Follow-up in 6 months or sooner with concerns. Declines pneumonia vaccine today.

## 2023-07-26 NOTE — Assessment & Plan Note (Signed)
Check vitamin D levels today and treat based on results. 

## 2023-07-26 NOTE — Telephone Encounter (Signed)
 Pt would like a referral for a social worker that she can be seen by

## 2023-07-26 NOTE — Telephone Encounter (Signed)
 I called and spoke with patient nd she said that since she is from not around her it is heard to ask anyone to get appointments or referrals made for example she wanted to get a tooth pulled and she was told the next available is August 2025. She just wants someone to talk to get help from her overall day to day life and transportation too she stated at the end.

## 2023-07-26 NOTE — Assessment & Plan Note (Signed)
She has a history of multiple DVTs. Will have her continue xarelto 20mg  daily.

## 2023-07-26 NOTE — Patient Instructions (Signed)
 It was great to see you!  We are checking your labs today and will let you know the results via mychart/phone.   We have you scheduled for a mammogram on 08/27/23 at [facility] injection once a week to help with weight loss.   Let's follow-up in 6-8 weeks, sooner if you have concerns.  If a referral was placed today, you will be contacted for an appointment. Please note that routine referrals can sometimes take up to 3-4 weeks to process. Please call our office if you haven't heard anything after this time frame.  Take care,  Rodman Pickle, NP

## 2023-07-26 NOTE — Assessment & Plan Note (Signed)
 BMI 46.4. She did not tolerate phentermine as it caused headache. Discussed nutrition and exercise. Recommend eating 1500-1800 calories per day and getting 150 minutes of exercise per week. Will start wegovy 0.25mg  injection weekly. Discussed possible side effects. Follow-up in 6-8 weeks.

## 2023-07-26 NOTE — Assessment & Plan Note (Signed)
 Chronic, stable. BP currently 132/84 and is controlled with diet and exercise. Keep checking blood pressure at home. Check CMP, CBC, lipid panel today.

## 2023-07-27 ENCOUNTER — Telehealth: Payer: Self-pay

## 2023-07-27 ENCOUNTER — Other Ambulatory Visit (HOSPITAL_COMMUNITY): Payer: Self-pay

## 2023-07-27 ENCOUNTER — Encounter: Payer: Self-pay | Admitting: Nurse Practitioner

## 2023-07-27 ENCOUNTER — Telehealth: Payer: Self-pay | Admitting: Pharmacy Technician

## 2023-07-27 LAB — COMPREHENSIVE METABOLIC PANEL
ALT: 13 U/L (ref 0–35)
AST: 13 U/L (ref 0–37)
Albumin: 3.9 g/dL (ref 3.5–5.2)
Alkaline Phosphatase: 41 U/L (ref 39–117)
BUN: 10 mg/dL (ref 6–23)
CO2: 27 meq/L (ref 19–32)
Calcium: 9.2 mg/dL (ref 8.4–10.5)
Chloride: 105 meq/L (ref 96–112)
Creatinine, Ser: 0.78 mg/dL (ref 0.40–1.20)
GFR: 85.67 mL/min (ref >60.00)
Glucose, Bld: 77 mg/dL (ref 70–99)
Potassium: 3.7 meq/L (ref 3.5–5.1)
Sodium: 140 meq/L (ref 135–145)
Total Bilirubin: 0.3 mg/dL (ref 0.2–1.2)
Total Protein: 7 g/dL (ref 6.0–8.3)

## 2023-07-27 LAB — CBC WITH DIFFERENTIAL/PLATELET
Basophils Absolute: 0 10*3/uL (ref 0.0–0.1)
Basophils Relative: 0.7 % (ref 0.0–3.0)
Eosinophils Absolute: 0.2 10*3/uL (ref 0.0–0.7)
Eosinophils Relative: 2.9 % (ref 0.0–5.0)
HCT: 38.5 % (ref 36.0–46.0)
Hemoglobin: 12.6 g/dL (ref 12.0–15.0)
Lymphocytes Relative: 47.7 % — ABNORMAL HIGH (ref 12.0–46.0)
Lymphs Abs: 2.5 10*3/uL (ref 0.7–4.0)
MCHC: 32.7 g/dL (ref 30.0–36.0)
MCV: 94.2 fl (ref 78.0–100.0)
Monocytes Absolute: 0.3 10*3/uL (ref 0.1–1.0)
Monocytes Relative: 5.7 % (ref 3.0–12.0)
Neutro Abs: 2.2 10*3/uL (ref 1.4–7.7)
Neutrophils Relative %: 43 % (ref 43.0–77.0)
Platelets: 238 10*3/uL (ref 150.0–400.0)
RBC: 4.09 Mil/uL (ref 3.87–5.11)
RDW: 13.9 % (ref 11.5–15.5)
WBC: 5.2 10*3/uL (ref 4.0–10.5)

## 2023-07-27 LAB — LIPID PANEL
Cholesterol: 164 mg/dL (ref 0–200)
HDL: 44.3 mg/dL (ref >39.00)
LDL Cholesterol: 99 mg/dL (ref 0–99)
NonHDL: 119.71
Total CHOL/HDL Ratio: 4
Triglycerides: 105 mg/dL (ref 0.0–149.0)
VLDL: 21 mg/dL (ref 0.0–40.0)

## 2023-07-27 LAB — VITAMIN D 25 HYDROXY (VIT D DEFICIENCY, FRACTURES): VITD: 24.75 ng/mL — ABNORMAL LOW (ref 30.00–100.00)

## 2023-07-27 LAB — HEMOGLOBIN A1C: Hgb A1c MFr Bld: 5.8 % (ref 4.6–6.5)

## 2023-07-27 NOTE — Telephone Encounter (Signed)
 Copied from CRM [id number]. Topic: Clinical - Medication Question >> Jul 27, 2023  1:51 PM Kathryne Eriksson wrote: Reason for CRM: Requesting A Call Back >> Jul 27, 2023  1:52 PM Kathryne Eriksson wrote: Patient is requesting to speak back with Paulita Fujita, Elpidio Anis, CMA in regards to a medication. Patient call back number is [phone]

## 2023-07-27 NOTE — Telephone Encounter (Signed)
 Message sent to patient via Mychart.

## 2023-07-27 NOTE — Telephone Encounter (Signed)
 Pharmacy Patient Advocate Encounter   Received notification from CoverMyMeds that prior authorization for [name] 0.25MG /0.5ML is required/requested.   Insurance verification completed.   The patient is insured through [facility] [employer] .   Per test claim: [id number]  SUBMITTED AND PENDING

## 2023-07-27 NOTE — Progress Notes (Signed)
   Telephone encounter was:  Unsuccessful.  07/27/2023 Name: Lynn Pennington MRN: [id number] DOB: 06/16/1970  Unsuccessful outbound call made today to assist with:  Transportation Needs   Outreach Attempt:  1st Attempt  A HIPAA compliant voice message was left requesting a return call.  Instructed patient to call back   Lenard Forth [facility] Guide, Phone: [phone] Fax: [phone] Website: [email or web address]

## 2023-07-30 ENCOUNTER — Telehealth: Payer: Self-pay

## 2023-07-30 ENCOUNTER — Other Ambulatory Visit (HOSPITAL_COMMUNITY): Payer: Self-pay

## 2023-07-30 NOTE — Telephone Encounter (Signed)
 Pharmacy Patient Advocate Encounter  Received notification from [facility] Medicaid that Prior Authorization for [facility] 0.25MG  has been APPROVED from 07/27/23 to 01/23/24. Ran test claim, Copay is $4.00. This test claim was processed through [facility]- copay amounts may vary at other pharmacies due to pharmacy/plan contracts, or as the patient moves through the different stages of their insurance plan.   PA #/Case ID/Reference #: [id number]

## 2023-07-30 NOTE — Progress Notes (Signed)
   Telephone encounter was:  Successful.  Complex Care Management Note Care Guide Note  07/30/2023 Name: Tesslyn Baumert MRN: [id number] DOB: [birth year]  Denissa Cozart is a 53 y.o. year old female who is a primary care patient of McElwee, Lauren A, NP . The community resource team was consulted for assistance with Transportation Needs   SDOH screenings and interventions completed:  Yes  Social Drivers of Health From This Encounter   Transportation Needs: Unmet Transportation Needs (07/30/2023)   PRAPARE - Administrator, Civil Service (Medical): Yes    Lack of Transportation (Non-Medical): Yes    SDOH Interventions Today    Flowsheet Row Most Recent Value  SDOH Interventions   Transportation Interventions Community Resources Provided        Care guide performed the following interventions: Patient provided with information about care guide support team and interviewed to confirm resource needs.Patient requested transportation and [employer] to be mailed to her. I did give [employer] card resources over the phone. Patient had Medicaid transportation as well   Follow Up Plan:  No further follow up planned at this time. The patient has been provided with needed resources.  Encounter Outcome:  Patient Visit Completed    Lenard Forth [facility]  [facility] Guide, Phone: [phone] Fax: [phone] Website: [email or web address]

## 2023-08-27 ENCOUNTER — Inpatient Hospital Stay: Admission: RE | Admit: 2023-08-27 | Source: Ambulatory Visit

## 2023-08-27 ENCOUNTER — Telehealth: Payer: Self-pay

## 2023-08-27 NOTE — Telephone Encounter (Signed)
 LVM for pt to call the office if planning on coming to mobile mammo appt. If she can make it by 4:10pm, can still be seen.

## 2023-08-29 ENCOUNTER — Other Ambulatory Visit: Payer: Self-pay | Admitting: Nurse Practitioner

## 2023-08-29 NOTE — Telephone Encounter (Signed)
 Requesting: WEGOVY  0.25MG /0.5ML INJ ( 4 PENS)  Last Visit: 07/26/2023 Next Visit: 09/06/2023 Last Refill: 07/26/2023  Please Advise

## 2023-08-30 NOTE — Telephone Encounter (Signed)
 Left message for patient to return call.

## 2023-08-30 NOTE — Telephone Encounter (Signed)
 Spoke with pt, she would like to go up a strength.

## 2023-08-30 NOTE — Telephone Encounter (Signed)
 Copied from CRM [id number]. Topic: General - Other >> Aug 30, 2023  2:07 PM Kita Perish H wrote: Reason for CRM: Patient returning call to [place], please reach back out when you can, thanks.  Forever [phone]

## 2023-08-31 ENCOUNTER — Other Ambulatory Visit: Payer: Self-pay | Admitting: Nurse Practitioner

## 2023-08-31 MED ORDER — WEGOVY 0.5 MG/0.5ML ~~LOC~~ SOAJ: 0.5000 mg | SUBCUTANEOUS | 1 refills | Status: AC

## 2023-09-06 ENCOUNTER — Ambulatory Visit: Admitting: Nurse Practitioner

## 2023-10-05 ENCOUNTER — Ambulatory Visit: Payer: Self-pay | Admitting: Nurse Practitioner

## 2023-10-05 ENCOUNTER — Encounter (HOSPITAL_COMMUNITY): Payer: Self-pay | Admitting: *Deleted

## 2023-10-05 ENCOUNTER — Emergency Department (HOSPITAL_COMMUNITY)

## 2023-10-05 ENCOUNTER — Telehealth: Payer: Self-pay | Admitting: Nurse Practitioner

## 2023-10-05 ENCOUNTER — Ambulatory Visit: Admission: EM | Admit: 2023-10-05 | Discharge: 2023-10-05 | Disposition: A | Discharge status: Home / Self Care

## 2023-10-05 ENCOUNTER — Other Ambulatory Visit: Payer: Self-pay

## 2023-10-05 ENCOUNTER — Emergency Department (HOSPITAL_COMMUNITY)
Admission: EM | Admit: 2023-10-05 | Discharge: 2023-10-05 | Disposition: A | Discharge status: Home / Self Care | Attending: Emergency Medicine | Admitting: Emergency Medicine

## 2023-10-05 DIAGNOSIS — H539 Unspecified visual disturbance: Secondary | ICD-10-CM

## 2023-10-05 DIAGNOSIS — R519 Headache, unspecified: Secondary | ICD-10-CM | POA: Diagnosis not present

## 2023-10-05 DIAGNOSIS — R109 Unspecified abdominal pain: Secondary | ICD-10-CM | POA: Insufficient documentation

## 2023-10-05 DIAGNOSIS — Z7901 Long term (current) use of anticoagulants: Secondary | ICD-10-CM | POA: Diagnosis not present

## 2023-10-05 DIAGNOSIS — R410 Disorientation, unspecified: Secondary | ICD-10-CM

## 2023-10-05 DIAGNOSIS — I10 Essential (primary) hypertension: Secondary | ICD-10-CM | POA: Insufficient documentation

## 2023-10-05 DIAGNOSIS — Z86718 Personal history of other venous thrombosis and embolism: Secondary | ICD-10-CM | POA: Diagnosis not present

## 2023-10-05 DIAGNOSIS — R2242 Localized swelling, mass and lump, left lower limb: Secondary | ICD-10-CM | POA: Diagnosis present

## 2023-10-05 DIAGNOSIS — M7989 Other specified soft tissue disorders: Secondary | ICD-10-CM | POA: Insufficient documentation

## 2023-10-05 DIAGNOSIS — J45909 Unspecified asthma, uncomplicated: Secondary | ICD-10-CM | POA: Diagnosis not present

## 2023-10-05 DIAGNOSIS — R0789 Other chest pain: Secondary | ICD-10-CM | POA: Insufficient documentation

## 2023-10-05 LAB — CBC
HCT: 37.4 % (ref 36.0–46.0)
Hemoglobin: 12 g/dL (ref 12.0–15.0)
MCH: 30.6 pg (ref 26.0–34.0)
MCHC: 32.1 g/dL (ref 30.0–36.0)
MCV: 95.4 fL (ref 80.0–100.0)
Platelets: 270 10*3/uL (ref 150–400)
RBC: 3.92 MIL/uL (ref 3.87–5.11)
RDW: 13.5 % (ref 11.5–15.5)
WBC: 6 10*3/uL (ref 4.0–10.5)
nRBC: 0 % (ref 0.0–0.2)

## 2023-10-05 LAB — COMPREHENSIVE METABOLIC PANEL WITH GFR
ALT: 301 U/L — ABNORMAL HIGH (ref 0–44)
AST: 88 U/L — ABNORMAL HIGH (ref 15–41)
Albumin: 3.5 g/dL (ref 3.5–5.0)
Alkaline Phosphatase: 72 U/L (ref 38–126)
Anion gap: 9 (ref 5–15)
BUN: 8 mg/dL (ref 6–20)
CO2: 24 mmol/L (ref 22–32)
Calcium: 9.3 mg/dL (ref 8.9–10.3)
Chloride: 108 mmol/L (ref 98–111)
Creatinine, Ser: 0.9 mg/dL (ref 0.44–1.00)
GFR, Estimated: >60 mL/min (ref >60)
Glucose, Bld: 119 mg/dL — ABNORMAL HIGH (ref 70–99)
Potassium: 3.8 mmol/L (ref 3.5–5.1)
Sodium: 141 mmol/L (ref 135–145)
Total Bilirubin: 0.8 mg/dL (ref 0.0–1.2)
Total Protein: 6.8 g/dL (ref 6.5–8.1)

## 2023-10-05 LAB — TROPONIN I (HIGH SENSITIVITY)
Troponin I (High Sensitivity): 9 ng/L (ref <18)
Troponin I (High Sensitivity): 9 ng/L (ref <18)

## 2023-10-05 LAB — LIPASE, BLOOD: Lipase: 40 U/L (ref 11–51)

## 2023-10-05 MED ORDER — IOHEXOL 350 MG/ML SOLN
75.0000 mL | Freq: Once | INTRAVENOUS | Status: AC | PRN
  Administered 2023-10-05: 75 mL via INTRAVENOUS

## 2023-10-05 NOTE — Telephone Encounter (Signed)
 Noted

## 2023-10-05 NOTE — ED Notes (Signed)
 Patient is being discharged from the Urgent Care and sent to the Emergency Department via private vehicle. Pt refused EMS transport. Pt states she is taking an Pharmacist, community. Per [place] D[place] PA, patient is in need of higher level of care due to fatigue, vision changes, and headaches. Patient is aware and verbalizes understanding of plan of care.   Vitals:   10/05/23 1232  BP: 133/89  Pulse: 66  Resp: 20  Temp: 98.4 F (36.9 C)  SpO2: 97%

## 2023-10-05 NOTE — Telephone Encounter (Signed)
 Chief Complaint: Blood pressure fluctuating  Symptoms: Blurred vision x2 months, swelling above knee Pertinent Negatives: Patient denies headache  Disposition: [x] ED   Additional Notes: Patient thinks she had food poisoning a couple nights ago. Patient states she has noticed her blood pressure has been going up and down. Pt current BP is 161/86 and HR 67. Pt also states she has a new onset of blurred vision. This RN advised pt to go to ED and have another adult drive. Pt agreeable.    Copied from CRM [id number]. Topic: Clinical - Red Word Triage >> Oct 05, 2023 11:02 AM Deaijah H wrote: Red Word that prompted transfer to Nurse Triage: BP high (going up and down) no medication. Reason for Disposition  [1] Systolic BP  >= 160 OR Diastolic >= 100 AND [2] cardiac (e.g., breathing difficulty, chest pain) or neurologic symptoms (e.g., new-onset blurred or double vision, unsteady gait)  Answer Assessment - Initial Assessment Questions Chief Complaint: Blood pressure fluctuating  Symptoms: Blurred vision x2 months, swelling above knee  Pertinent Negatives: Patient denies headache  Protocols used: Blood Pressure - High-A-AH

## 2023-10-05 NOTE — ED Provider Notes (Signed)
 [place] EMERGENCY DEPARTMENT AT [facility] Provider Note   CSN: [id number] Arrival date & time: 10/05/23  1627     History  Chief Complaint  Patient presents with   Chest Pain   Leg Pain    Lynn Pennington is a 53 y.o. female.  Pt is a 53 yo female with pmhx significant for DVT, HTN, asthma, anxiety, and depression.  Pt said she does not always take her Xarelto  every day.  She thought that her dose of 20 mg would last a few days.  Pt presents to the ED today with left leg swelling, cp and abd pain.  She has also had some blurry vision sometimes, but has not seen an eye doctor in years.        Home Medications Prior to Admission medications   Medication Sig Start Date End Date Taking? Authorizing Provider  albuterol  (VENTOLIN  HFA) 108 (90 Base) MCG/ACT inhaler Inhale 1-2 puffs into the lungs every 6 (six) hours as needed for wheezing or shortness of breath. 07/26/23   McElwee, Lauren A, NP  EPINEPHrine  0.3 mg/0.3 mL IJ SOAJ injection Inject 0.3 mg into the muscle as needed for anaphylaxis. Patient not taking: Reported on 07/26/2023 07/31/22   Odette Benjamin, NP  rivaroxaban  (XARELTO ) 20 MG TABS tablet Take 1 tablet (20 mg total) by mouth daily with supper. 07/26/23   McElwee, Adolfo Hooker, NP  Semaglutide -Weight Management (WEGOVY ) 0.5 MG/0.5ML SOAJ Inject 0.5 mg into the skin once a week. 08/31/23   McElwee, Adolfo Hooker, NP      Allergies    Bee venom and Penicillins    Review of Systems   Review of Systems  Cardiovascular:  Positive for chest pain.  Musculoskeletal:        LLE swelling and pain  All other systems reviewed and are negative.   Physical Exam Updated Vital Signs BP (!) 150/78   Pulse 61   Temp 98.2 F (36.8 C)   Resp (!) 21   Ht 5\' 4"  (1.626 m)   Wt 117.9 kg   SpO2 100%   BMI 44.62 kg/m  Physical Exam Vitals and nursing note reviewed.  Constitutional:      Appearance: She is well-developed.  HENT:     Head: Normocephalic and atraumatic.   Eyes:     Extraocular Movements: Extraocular movements intact.     Pupils: Pupils are equal, round, and reactive to light.  Cardiovascular:     Rate and Rhythm: Normal rate and regular rhythm.     Heart sounds: Normal heart sounds.  Pulmonary:     Effort: Pulmonary effort is normal.     Breath sounds: Normal breath sounds.  Abdominal:     General: Bowel sounds are normal.     Palpations: Abdomen is soft.  Musculoskeletal:        General: Normal range of motion.     Cervical back: Normal range of motion and neck supple.     Left lower leg: Tenderness present. Edema present.  Skin:    General: Skin is warm.     Capillary Refill: Capillary refill takes less than 2 seconds.  Neurological:     General: No focal deficit present.     Mental Status: She is alert and oriented to person, place, and time.  Psychiatric:        Mood and Affect: Mood normal.        Behavior: Behavior normal.     ED Results / Procedures / Treatments  Labs (all labs ordered are listed, but only abnormal results are displayed) Labs Reviewed  COMPREHENSIVE METABOLIC PANEL WITH GFR - Abnormal; Notable for the following components:      Result Value   Glucose, Bld 119 (*)    AST 88 (*)    ALT 301 (*)    All other components within normal limits  CBC  LIPASE, BLOOD  TROPONIN I (HIGH SENSITIVITY)  TROPONIN I (HIGH SENSITIVITY)    EKG EKG Interpretation Date/Time:  Friday Oct 05 2023 16:41:40 EDT Ventricular Rate:  83 PR Interval:  158 QRS Duration:  78 QT Interval:  364 QTC Calculation: 427 R Axis:   -9  Text Interpretation: Normal sinus rhythm Minimal voltage criteria for LVH, may be normal variant ( R in aVL ) Nonspecific ST and T wave abnormality Abnormal ECG When compared with ECG of 21-May-2020 18:29, PREVIOUS ECG IS PRESENT t wave inversions have resolved Confirmed by Sueellen Emery [phone]) on 10/05/2023 6:08:54 PM  Radiology CT Angio Chest Pulmonary Embolism (PE) W or WO Contrast Result  Date: 10/05/2023 CLINICAL DATA:  Epigastric pain EXAM: CT ANGIOGRAPHY CHEST CT ABDOMEN AND PELVIS WITH CONTRAST TECHNIQUE: Multidetector CT imaging of the chest was performed using the standard protocol during bolus administration of intravenous contrast. Multiplanar CT image reconstructions and MIPs were obtained to evaluate the vascular anatomy. Multidetector CT imaging of the abdomen and pelvis was performed using the standard protocol during bolus administration of intravenous contrast. RADIATION DOSE REDUCTION: This exam was performed according to the departmental dose-optimization program which includes automated exposure control, adjustment of the mA and/or kV according to patient size and/or use of iterative reconstruction technique. CONTRAST:  75mL OMNIPAQUE  IOHEXOL  350 MG/ML SOLN COMPARISON:  Chest x-ray 10/05/2023, CT chest 05/21/2020 FINDINGS: CTA CHEST FINDINGS Cardiovascular: Satisfactory opacification of the pulmonary arteries to the segmental level. No evidence of pulmonary embolism. Nonaneurysmal aorta. Mild cardiomegaly. No pericardial effusion Mediastinum/Nodes: Patent trachea. No thyroid mass. No suspicious lymph nodes. Esophagus within normal limits. Lungs/Pleura: Lungs are clear. No pleural effusion or pneumothorax. Musculoskeletal: No chest wall abnormality. No acute or significant osseous findings. Review of the MIP images confirms the above findings. CT ABDOMEN and PELVIS FINDINGS Hepatobiliary: No focal liver abnormality is seen. No gallstones, gallbladder wall thickening, or biliary dilatation. Pancreas: Unremarkable. No pancreatic ductal dilatation or surrounding inflammatory changes. Spleen: Normal in size without focal abnormality. Adrenals/Urinary Tract: Nodular thickening of the adrenal glands without dominant nodule. Kidneys are normal, without renal calculi, focal lesion, or hydronephrosis. Bladder is unremarkable. Stomach/Bowel: Stomach is within normal limits. Appendix appears  normal. No evidence of bowel wall thickening, distention, or inflammatory changes. Vascular/Lymphatic: No significant vascular findings are present. No enlarged abdominal or pelvic lymph nodes. Reproductive: Status post hysterectomy. No adnexal masses. Other: No abdominal wall hernia or abnormality. No abdominopelvic ascites. Musculoskeletal: No acute or significant osseous findings. Review of the MIP images confirms the above findings. IMPRESSION: 1. Negative for acute pulmonary embolus.  Mild cardiomegaly. 2. No CT evidence for acute intra-abdominal or pelvic abnormality. Electronically Signed   By: Esmeralda Hedge M.D.   On: 10/05/2023 22:16   CT ABDOMEN PELVIS W CONTRAST Result Date: 10/05/2023 CLINICAL DATA:  Epigastric pain EXAM: CT ANGIOGRAPHY CHEST CT ABDOMEN AND PELVIS WITH CONTRAST TECHNIQUE: Multidetector CT imaging of the chest was performed using the standard protocol during bolus administration of intravenous contrast. Multiplanar CT image reconstructions and MIPs were obtained to evaluate the vascular anatomy. Multidetector CT imaging of the abdomen and pelvis was performed using  the standard protocol during bolus administration of intravenous contrast. RADIATION DOSE REDUCTION: This exam was performed according to the departmental dose-optimization program which includes automated exposure control, adjustment of the mA and/or kV according to patient size and/or use of iterative reconstruction technique. CONTRAST:  75mL OMNIPAQUE  IOHEXOL  350 MG/ML SOLN COMPARISON:  Chest x-ray 10/05/2023, CT chest 05/21/2020 FINDINGS: CTA CHEST FINDINGS Cardiovascular: Satisfactory opacification of the pulmonary arteries to the segmental level. No evidence of pulmonary embolism. Nonaneurysmal aorta. Mild cardiomegaly. No pericardial effusion Mediastinum/Nodes: Patent trachea. No thyroid mass. No suspicious lymph nodes. Esophagus within normal limits. Lungs/Pleura: Lungs are clear. No pleural effusion or pneumothorax.  Musculoskeletal: No chest wall abnormality. No acute or significant osseous findings. Review of the MIP images confirms the above findings. CT ABDOMEN and PELVIS FINDINGS Hepatobiliary: No focal liver abnormality is seen. No gallstones, gallbladder wall thickening, or biliary dilatation. Pancreas: Unremarkable. No pancreatic ductal dilatation or surrounding inflammatory changes. Spleen: Normal in size without focal abnormality. Adrenals/Urinary Tract: Nodular thickening of the adrenal glands without dominant nodule. Kidneys are normal, without renal calculi, focal lesion, or hydronephrosis. Bladder is unremarkable. Stomach/Bowel: Stomach is within normal limits. Appendix appears normal. No evidence of bowel wall thickening, distention, or inflammatory changes. Vascular/Lymphatic: No significant vascular findings are present. No enlarged abdominal or pelvic lymph nodes. Reproductive: Status post hysterectomy. No adnexal masses. Other: No abdominal wall hernia or abnormality. No abdominopelvic ascites. Musculoskeletal: No acute or significant osseous findings. Review of the MIP images confirms the above findings. IMPRESSION: 1. Negative for acute pulmonary embolus.  Mild cardiomegaly. 2. No CT evidence for acute intra-abdominal or pelvic abnormality. Electronically Signed   By: Esmeralda Hedge M.D.   On: 10/05/2023 22:16   DG Chest 1 View Result Date: 10/05/2023 CLINICAL DATA:  Chest pain. EXAM: CHEST  1 VIEW COMPARISON:  09/16/2020. FINDINGS: The heart size and mediastinal contours are within normal limits. No focal consolidation, pleural effusion, or pneumothorax. No acute osseous abnormality. IMPRESSION: No acute cardiopulmonary findings. Electronically Signed   By: Mannie Seek M.D.   On: 10/05/2023 19:10    Procedures Procedures    Medications Ordered in ED Medications  iohexol  (OMNIPAQUE ) 350 MG/ML injection 75 mL (75 mLs Intravenous Contrast Given 10/05/23 2132)    ED Course/ Medical Decision  Making/ A&P                                 Medical Decision Making Amount and/or Complexity of Data Reviewed Radiology: ordered.  Risk Prescription drug management.   This patient presents to the ED for concern of cp and leg pain, this involves an extensive number of treatment options, and is a complaint that carries with it a high risk of complications and morbidity.  The differential diagnosis includes dvt, pe, cellulitis, gastritis   Co morbidities that complicate the patient evaluation  DVT, HTN, asthma, anxiety, and depression   Additional history obtained:  Additional history obtained from epic chart review  Lab Tests:  I Ordered, and personally interpreted labs.  The pertinent results include:  cbc nl, cmp nl other than ast elevated at 88 and alt elevated at 301; trop nl; lip nl   Imaging Studies ordered:  I ordered imaging studies including cxr, cta chest, ct abd/pelvis  I independently visualized and interpreted imaging which showed  CXR: No acute cardiopulmonary findings.  CTA chest:   Negative for acute pulmonary embolus.  Mild cardiomegaly.  2. No CT evidence  for acute intra-abdominal or pelvic abnormality.  CT abd/pelvis: Negative for acute pulmonary embolus.  Mild cardiomegaly.  2. No CT evidence for acute intra-abdominal or pelvic abnormality.   I agree with the radiologist interpretation   Cardiac Monitoring:  The patient was maintained on a cardiac monitor.  I personally viewed and interpreted the cardiac monitored which showed an underlying rhythm of: nsr   Medicines ordered and prescription drug management:   I have reviewed the patients home medicines and have made adjustments as needed   Test Considered:  ct  Problem List / ED Course:  Cp:  atypical.  Pt's cardiac work up neg.   CTA chest neg for PE.  Cp likely gi. LLE swelling:  pt has not been compliant with her Xarelto .  She is to take 20 mg daily.  US  ordered for tomorrow as  vascular us  is gone for the day.   Reevaluation:  After the interventions noted above, I reevaluated the patient and found that they have :improved   Social Determinants of Health:  Lives at home   Dispostion:  After consideration of the diagnostic results and the patients response to treatment, I feel that the patent would benefit from discharge with outpatient f/u.          Final Clinical Impression(s) / ED Diagnoses Final diagnoses:  Atypical chest pain  Leg swelling    Rx / DC Orders ED Discharge Orders          Ordered    [facility]        10/05/23 2250              Sueellen Emery, MD 10/05/23 2254

## 2023-10-05 NOTE — ED Triage Notes (Signed)
 Pt presents with a chief complaint of fatigue. Pt states her symptoms began about three days ago with mid-abdominal pain. Last BM was 2 days ago. OTC MiraLAX +antacid taken. Pain in abdomen has now resolved. Pt is now reporting "feeling hazy", "feverish", lightheadedness, and dizziness. Pt states "I felt like I lost my vision about two days ago, it was very difficult to read." Pt currently denies pain. Hx of blood clots, on blood thinners.

## 2023-10-05 NOTE — Discharge Instructions (Addendum)
 You were seen today for concerns of your blood pressure fluctuating as well as leg swelling, vision changes, intermittent headaches and mental fogginess.  At this time I recommend that you go to the emergency room for further evaluation and appropriate imaging as these symptoms could be signs of a blood clot or stroke.  I do not recommend that you drive yourself.  Instead please consider hiring an [employer] or taxi or contacting a friend or family member to take you to the emergency room.  If at any point you start to have more severe symptoms I recommend calling EMS for further assistance.

## 2023-10-05 NOTE — Telephone Encounter (Signed)
 09/04/22 no  show 10/16/22 no show 09/06/2023 no show  Dismissal generated

## 2023-10-05 NOTE — ED Provider Notes (Signed)
 [facility]    CSN: [id number] Arrival date & time: 10/05/23  1218      History   Chief Complaint Chief Complaint  Patient presents with   Fatigue    HPI Lynn Pennington is a 53 y.o. female.   HPI Pt reports that about 3 days ago she started to have abdominal pain, nausea, and felt feverish She states she the abdominal pain was so severe she almost passed out She states she has also been having vision changes that is not improving  She reports having some lower extremity swelling as well, reports this has been ongoing for about 2 weeks. Seems to improve with elevation but then swelling returns. She reports some itching along her left shin that is new. She reports previous hx of left leg swelling and DVTs in that leg.   She states she did have a sharp, transient headache that started this AM when she got off work  She also reports fluctuations with her blood pressure -states it has been getting up into 160s systolic at home   She reports some SOB but states she needs to go get her asthma medications/ inhalers    Past Medical History:  Diagnosis Date   Anxiety    Asthma    Chronic leg pain    Depression    DVT (deep venous thrombosis) (HCC) 2010, 2016   History of femur fracture    left   Hypertension    Iron deficiency    Major depressive disorder with current active episode 11/13/2019   Patient reported history of depression that was treated with Sertraline .     Patient Active Problem List   Diagnosis Date Noted   Routine general medical examination at a health care facility 07/26/2023   Morbid obesity (HCC) 08/01/2022   Vitamin D  deficiency 08/01/2022   Nausea 08/01/2022   Healthcare maintenance 02/04/2020   History of recurrent deep vein thrombosis (DVT) 11/13/2019   Asthma 11/13/2019   Chronic leg pain 11/13/2019   Hypertension 11/13/2019    Past Surgical History:  Procedure Laterality Date   ABDOMINAL HYSTERECTOMY     FRACTURE SURGERY Left     femur    OB History   No obstetric history on file.      Home Medications    Prior to Admission medications   Medication Sig Start Date End Date Taking? Authorizing Provider  albuterol  (VENTOLIN  HFA) 108 (90 Base) MCG/ACT inhaler Inhale 1-2 puffs into the lungs every 6 (six) hours as needed for wheezing or shortness of breath. 07/26/23   McElwee, Lauren A, NP  EPINEPHrine  0.3 mg/0.3 mL IJ SOAJ injection Inject 0.3 mg into the muscle as needed for anaphylaxis. Patient not taking: Reported on 07/26/2023 07/31/22   Rheba Cedar A, NP  rivaroxaban  (XARELTO ) 20 MG TABS tablet Take 1 tablet (20 mg total) by mouth daily with supper. 07/26/23   McElwee, Adolfo Hooker, NP  Semaglutide -Weight Management (WEGOVY ) 0.5 MG/0.5ML SOAJ Inject 0.5 mg into the skin once a week. 08/31/23   McElwee, Adolfo Hooker, NP    Family History Family History  Problem Relation Age of Onset   Hypertension Mother    Hypertension Sister    Cancer Maternal Grandmother        breast   Diabetes Maternal Aunt    Diabetes Maternal Uncle    Colon cancer Neg Hx    Esophageal cancer Neg Hx    Rectal cancer Neg Hx    Stomach cancer Neg Hx  Social History Social History   Tobacco Use   Smoking status: Former    Current packs/day: 0.00    Types: Cigarettes    Quit date: 07/28/2020    Years since quitting: 3.1   Smokeless tobacco: Never   Tobacco comments:    stopped 2 months ago   Vaping Use   Vaping status: Every Day   Substances: Nicotine  Substance Use Topics   Alcohol use: Not Currently   Drug use: Not Currently     Allergies   Bee venom and Penicillins   Review of Systems Review of Systems  Constitutional:  Positive for fever.  Eyes:  Positive for visual disturbance.  Respiratory:  Positive for shortness of breath.   Cardiovascular:  Positive for leg swelling. Negative for chest pain and palpitations.  Gastrointestinal:  Positive for abdominal pain.  Neurological:  Positive for light-headedness  and headaches. Negative for speech difficulty and weakness.  Psychiatric/Behavioral:  Positive for confusion.      Physical Exam Triage Vital Signs ED Triage Vitals  Encounter Vitals Group     BP 10/05/23 1232 133/89     Systolic BP Percentile --      Diastolic BP Percentile --      Pulse Rate 10/05/23 1232 66     Resp 10/05/23 1232 20     Temp 10/05/23 1232 98.4 F (36.9 C)     Temp Source 10/05/23 1232 Oral     SpO2 10/05/23 1232 97 %     Weight 10/05/23 1240 260 lb (117.9 kg)     Height 10/05/23 1240 5\' 4"  (1.626 m)     Head Circumference --      Peak Flow --      Pain Score 10/05/23 1240 0     Pain Loc --      Pain Education --      Exclude from Growth Chart --    No data found.  Updated Vital Signs BP 133/89 (BP Location: Right Arm)   Pulse 66   Temp 98.4 F (36.9 C) (Oral)   Resp 20   Ht 5\' 4"  (1.626 m)   Wt 260 lb (117.9 kg)   SpO2 97%   BMI 44.63 kg/m   Visual Acuity Right Eye Distance:   Left Eye Distance:   Bilateral Distance:    Right Eye Near:   Left Eye Near:    Bilateral Near:     Physical Exam Vitals reviewed.  Constitutional:      General: She is awake. She is not in acute distress.    Appearance: Normal appearance. She is well-developed and well-groomed. She is not ill-appearing or toxic-appearing.  HENT:     Head: Normocephalic and atraumatic.  Eyes:     General: Lids are normal.     Extraocular Movements: Extraocular movements intact.     Conjunctiva/sclera: Conjunctivae normal.  Cardiovascular:     Rate and Rhythm: Normal rate and regular rhythm.     Heart sounds: Normal heart sounds. No murmur heard.    No friction rub. No gallop.  Pulmonary:     Effort: Pulmonary effort is normal.     Breath sounds: Decreased air movement present. Examination of the right-lower field reveals decreased breath sounds. Examination of the left-lower field reveals decreased breath sounds. Decreased breath sounds present. No wheezing, rhonchi or  rales.  Musculoskeletal:     Cervical back: Normal range of motion.  Skin:    General: Skin is warm and dry.  Neurological:  General: No focal deficit present.     Mental Status: She is alert and oriented to person, place, and time.     GCS: GCS eye subscore is 4. GCS verbal subscore is 5. GCS motor subscore is 6.     Cranial Nerves: No cranial nerve deficit, dysarthria or facial asymmetry.     Motor: No tremor, atrophy or abnormal muscle tone.  Psychiatric:        Behavior: Behavior is cooperative.      UC Treatments / Results  Labs (all labs ordered are listed, but only abnormal results are displayed) Labs Reviewed - No data to display  EKG   Radiology No results found.  Procedures Procedures (including critical care time)  Medications Ordered in UC Medications - No data to display  Initial Impression / Assessment and Plan / UC Course  I have reviewed the triage vital signs and the nursing notes.  Pertinent labs & imaging results that were available during my care of the patient were reviewed by me and considered in my medical decision making (see chart for details).      Final Clinical Impressions(s) / UC Diagnoses   Final diagnoses:  Vision changes  Acute nonintractable headache, unspecified headache type  History of recurrent deep vein thrombosis (DVT)  Acute confusion   Patient presents today with concerns of fluctuating blood pressure, unilateral left leg swelling, persistent vision changes, intermittent headache and confusion.  She reports that the headache occurred earlier today and she is having difficulty with her blood pressure since getting off work.  She reports that she has had persistent vision changes for almost 2 weeks now.  Of note patient has a history of recurrent DVT and is taking Xarelto  daily for this.  During appointment she also voiced concerns for shortness of breath and chest tightness.  She states that she needs to go and pick up her  inhaler which will likely help with this.  Based on the patient's history as well as her constellation of symptoms I am concerned for potential blood clot, stroke, PE.  I reviewed with the patient that we cannot confirm or rule out these conditions here in urgent care and she would need to be seen in the emergency room for further evaluation.  Patient declined EMS transportation today and stated that she would go by private vehicle.  I reviewed with patient that it is not recommended that she drives herself and she should get a friend or family member or hire transportation service per her preference to get to the emergency room.  Emergency room's that are with a close is distant reviewed with patient prior to discharge.  Recommend close follow-up with primary care provider following ED evaluation.     Discharge Instructions      You were seen today for concerns of your blood pressure fluctuating as well as leg swelling, vision changes, intermittent headaches and mental fogginess.  At this time I recommend that you go to the emergency room for further evaluation and appropriate imaging as these symptoms could be signs of a blood clot or stroke.  I do not recommend that you drive yourself.  Instead please consider hiring an [employer] or taxi or contacting a friend or family member to take you to the emergency room.  If at any point you start to have more severe symptoms I recommend calling EMS for further assistance.   ED Prescriptions   None    PDMP not reviewed this encounter.   Lynn Pennington,  [place], PA-C 10/05/23 1346

## 2023-10-05 NOTE — Telephone Encounter (Signed)
 Noted. Ptient advised to go to the ED by nurse triage.

## 2023-10-05 NOTE — ED Notes (Signed)
Pt back in room after CT scan.

## 2023-10-05 NOTE — ED Triage Notes (Signed)
 The pt is c/o epigastric pain for 2 days and she is having lt leg pain also  she was seen at a urgent care and they sent her here for treatment

## 2023-10-05 NOTE — ED Provider Triage Note (Signed)
 Emergency Medicine Provider Triage Evaluation Note  Lynn Pennington , a 53 y.o. female  was evaluated in triage.  Pt complains of left lower extremity pain and swelling, and pruritic chest pain.  Has a history of multiple DVTs.  She is on Xarelto , but reports noncompliance.  Review of Systems  Positive:  Negative:   Physical Exam  BP (!) 145/77 (BP Location: Right Arm)   Pulse 88   Temp 98.2 F (36.8 C)   Resp (!) 24   Ht 5\' 4"  (1.626 m)   Wt 117.9 kg   SpO2 96%   BMI 44.62 kg/m  Gen:   Awake, no distress   Resp:  Normal effort  MSK:   Moves extremities without difficulty, left lower extremity 2+ pitting edema Other:    Medical Decision Making  Medically screening exam initiated at 4:54 PM.  Appropriate orders placed.  Lynn Pennington was informed that the remainder of the evaluation will be completed by another provider, this initial triage assessment does not replace that evaluation, and the importance of remaining in the ED until their evaluation is complete.     Lynn Horning, PA-C 10/05/23 1655

## 2023-10-05 NOTE — Discharge Instructions (Addendum)
 Take your Xarelto  20 mg every day!

## 2023-10-06 ENCOUNTER — Ambulatory Visit (HOSPITAL_COMMUNITY): Admission: RE | Admit: 2023-10-06 | Source: Ambulatory Visit

## 2024-02-01 ENCOUNTER — Ambulatory Visit: Payer: Self-pay | Admitting: Emergency Medicine

## 2024-02-01 ENCOUNTER — Ambulatory Visit (INDEPENDENT_AMBULATORY_CARE_PROVIDER_SITE_OTHER): Admitting: Radiology

## 2024-02-01 ENCOUNTER — Encounter: Payer: Self-pay | Admitting: Emergency Medicine

## 2024-02-01 ENCOUNTER — Ambulatory Visit
Admission: EM | Admit: 2024-02-01 | Discharge: 2024-02-01 | Disposition: A | Discharge status: Home / Self Care | Attending: Emergency Medicine | Admitting: Emergency Medicine

## 2024-02-01 DIAGNOSIS — J4521 Mild intermittent asthma with (acute) exacerbation: Secondary | ICD-10-CM

## 2024-02-01 LAB — POC COVID19/FLU A&B COMBO
Covid Antigen, POC: NEGATIVE
Influenza A Antigen, POC: NEGATIVE
Influenza B Antigen, POC: NEGATIVE

## 2024-02-01 MED ORDER — IPRATROPIUM-ALBUTEROL 0.5-2.5 (3) MG/3ML IN SOLN
3.0000 mL | Freq: Once | RESPIRATORY_TRACT | Status: AC
  Administered 2024-02-01: 3 mL via RESPIRATORY_TRACT

## 2024-02-01 MED ORDER — ALBUTEROL SULFATE (2.5 MG/3ML) 0.083% IN NEBU
2.5000 mg | INHALATION_SOLUTION | Freq: Once | RESPIRATORY_TRACT | Status: AC
  Administered 2024-02-01: 2.5 mg via RESPIRATORY_TRACT

## 2024-02-01 MED ORDER — ACETAMINOPHEN 325 MG PO TABS
975.0000 mg | ORAL_TABLET | Freq: Once | ORAL | Status: AC
  Administered 2024-02-01: 975 mg via ORAL

## 2024-02-01 MED ORDER — PREDNISONE 20 MG PO TABS
40.0000 mg | ORAL_TABLET | Freq: Once | ORAL | Status: AC
  Administered 2024-02-01: 40 mg via ORAL

## 2024-02-01 MED ORDER — AZITHROMYCIN 250 MG PO TABS: 250.0000 mg | ORAL_TABLET | Freq: Every day | ORAL | 0 refills | Status: AC

## 2024-02-01 MED ORDER — PREDNISONE 20 MG PO TABS: 40.0000 mg | ORAL_TABLET | Freq: Every day | ORAL | 0 refills | Status: AC

## 2024-02-01 MED ORDER — ALBUTEROL SULFATE HFA 108 (90 BASE) MCG/ACT IN AERS
2.0000 | INHALATION_SPRAY | Freq: Once | RESPIRATORY_TRACT | Status: AC
  Administered 2024-02-01: 2 via RESPIRATORY_TRACT

## 2024-02-01 MED ORDER — BENZONATATE 100 MG PO CAPS: 100.0000 mg | ORAL_CAPSULE | Freq: Three times a day (TID) | ORAL | 0 refills | Status: AC

## 2024-02-01 NOTE — ED Triage Notes (Signed)
 Pt c/o sob, cough, wheezing since Sept 21st.

## 2024-02-01 NOTE — ED Provider Notes (Signed)
 [facility]    CSN: [id number] Arrival date & time: 02/01/24  1111      History   Chief Complaint Chief Complaint  Patient presents with   Cough   Shortness of Breath   Wheezing    HPI Lynn Pennington is a 53 y.o. female.   Patient presents to clinic over concern of wheezing and shortness of breath. Symptoms started with a cough and congestion on September 21st.  Have gradually gotten worse and noticed that she was wheezing and short of breath last night before she went to bed.  Has been taking over-the-counter Robitussin and other treatments without much improvement.  Does have a history of asthma, reports this is intermittent and she does not have an inhaler at home.  Unsure if she has had fevers, has not checked her temperature.  Has not had Tylenol  or ibuprofen today.  Reports she is feeling lightheaded due to her trouble breathing.  The history is provided by the patient and medical records.  Cough Shortness of Breath Wheezing   Past Medical History:  Diagnosis Date   Anxiety    Asthma    Chronic leg pain    Depression    DVT (deep venous thrombosis) (HCC) 2010, 2016   History of femur fracture    left   Hypertension    Iron deficiency    Major depressive disorder with current active episode 11/13/2019   Patient reported history of depression that was treated with Sertraline .     Patient Active Problem List   Diagnosis Date Noted   Routine general medical examination at a health care facility 07/26/2023   Morbid obesity (HCC) 08/01/2022   Vitamin D  deficiency 08/01/2022   Nausea 08/01/2022   Healthcare maintenance 02/04/2020   History of recurrent deep vein thrombosis (DVT) 11/13/2019   Asthma 11/13/2019   Chronic leg pain 11/13/2019   Hypertension 11/13/2019    Past Surgical History:  Procedure Laterality Date   ABDOMINAL HYSTERECTOMY     FRACTURE SURGERY Left    femur    OB History   No obstetric history on file.      Home  Medications    Prior to Admission medications   Medication Sig Start Date End Date Taking? Authorizing Provider  azithromycin  (ZITHROMAX ) 250 MG tablet Take 1 tablet (250 mg total) by mouth daily. Take first 2 tablets together, then 1 every day until finished. 02/01/24  Yes Srijan Givan  N, FNP  benzonatate  (TESSALON ) 100 MG capsule Take 1 capsule (100 mg total) by mouth every 8 (eight) hours. 02/01/24  Yes Debbra Digiulio  N, FNP  predniSONE  (DELTASONE ) 20 MG tablet Take 2 tablets (40 mg total) by mouth daily with breakfast for 4 days. 02/01/24 02/05/24 Yes Deem Marmol  N, FNP  albuterol  (VENTOLIN  HFA) 108 (90 Base) MCG/ACT inhaler Inhale 1-2 puffs into the lungs every 6 (six) hours as needed for wheezing or shortness of breath. 07/26/23   McElwee, Lauren A, NP  EPINEPHrine  0.3 mg/0.3 mL IJ SOAJ injection Inject 0.3 mg into the muscle as needed for anaphylaxis. Patient not taking: Reported on 07/26/2023 07/31/22   Nedra Tinnie LABOR, NP  rivaroxaban  (XARELTO ) 20 MG TABS tablet Take 1 tablet (20 mg total) by mouth daily with supper. 07/26/23   McElwee, Tinnie LABOR, NP  Semaglutide -Weight Management (WEGOVY ) 0.5 MG/0.5ML SOAJ Inject 0.5 mg into the skin once a week. 08/31/23   McElwee, Tinnie LABOR, NP    Family History Family History  Problem Relation Age of Onset  Hypertension Mother    Hypertension Sister    Cancer Maternal Grandmother        breast   Diabetes Maternal Aunt    Diabetes Maternal Uncle    Colon cancer Neg Hx    Esophageal cancer Neg Hx    Rectal cancer Neg Hx    Stomach cancer Neg Hx     Social History Social History   Tobacco Use   Smoking status: Former    Current packs/day: 0.00    Types: Cigarettes    Quit date: 07/28/2020    Years since quitting: 3.5   Smokeless tobacco: Never   Tobacco comments:    stopped 2 months ago   Vaping Use   Vaping status: Every Day   Substances: Nicotine  Substance Use Topics   Alcohol use: Not Currently   Drug use: Not  Currently     Allergies   Bee venom and Penicillins   Review of Systems Review of Systems  Per HPI  Physical Exam Triage Vital Signs ED Triage Vitals  Encounter Vitals Group     BP 02/01/24 1131 (!) 148/85     Girls Systolic BP Percentile --      Girls Diastolic BP Percentile --      Boys Systolic BP Percentile --      Boys Diastolic BP Percentile --      Pulse Rate 02/01/24 1131 76     Resp 02/01/24 1131 16     Temp 02/01/24 1131 100.1 F (37.8 C)     Temp Source 02/01/24 1131 Oral     SpO2 02/01/24 1131 96 %     Weight --      Height --      Head Circumference --      Peak Flow --      Pain Score 02/01/24 1134 6     Pain Loc --      Pain Education --      Exclude from Growth Chart --    No data found.  Updated Vital Signs BP (!) 148/85 (BP Location: Right Arm)   Pulse 76   Temp 100.1 F (37.8 C) (Oral)   Resp 16   SpO2 96%   Visual Acuity Right Eye Distance:   Left Eye Distance:   Bilateral Distance:    Right Eye Near:   Left Eye Near:    Bilateral Near:     Physical Exam Vitals and nursing note reviewed.  Constitutional:      Appearance: Normal appearance. She is well-developed.  HENT:     Head: Normocephalic and atraumatic.     Right Ear: External ear normal.     Left Ear: External ear normal.     Nose: Congestion and rhinorrhea present.     Mouth/Throat:     Mouth: Mucous membranes are moist.  Eyes:     Conjunctiva/sclera: Conjunctivae normal.  Cardiovascular:     Rate and Rhythm: Normal rate and regular rhythm.     Heart sounds: Normal heart sounds. No murmur heard. Pulmonary:     Breath sounds: Decreased breath sounds, wheezing and rhonchi present.  Skin:    General: Skin is warm and dry.  Neurological:     General: No focal deficit present.     Mental Status: She is alert and oriented to person, place, and time.  Psychiatric:        Mood and Affect: Mood normal.        Behavior: Behavior normal.  UC Treatments / Results   Labs (all labs ordered are listed, but only abnormal results are displayed) Labs Reviewed  POC COVID19/FLU A&B COMBO    EKG   Radiology DG Chest 2 View Result Date: 02/01/2024 EXAM: 2 VIEW(S) XRAY OF THE CHEST 02/01/2024 12:32:00 PM COMPARISON: 10/05/2023 CLINICAL HISTORY: cough, SOB, asthma hx. FINDINGS: LUNGS AND PLEURA: No focal pulmonary opacity. No pulmonary edema. No pleural effusion. No pneumothorax. HEART AND MEDIASTINUM: No acute abnormality of the cardiac and mediastinal silhouettes. BONES AND SOFT TISSUES: No acute osseous abnormality. IMPRESSION: 1. Normal chest radiograph. No acute cardiopulmonary process. Electronically signed by: Waddell Calk MD 02/01/2024 01:07 PM EDT RP Workstation: [id number]    Procedures Procedures (including critical care time)  Medications Ordered in UC Medications  acetaminophen  (TYLENOL ) tablet 975 mg (975 mg Oral Given 02/01/24 1151)  ipratropium-albuterol  (DUONEB) 0.5-2.5 (3) MG/3ML nebulizer solution 3 mL (3 mLs Nebulization Given 02/01/24 1152)  albuterol  (PROVENTIL ) (2.5 MG/3ML) 0.083% nebulizer solution 2.5 mg (2.5 mg Nebulization Given 02/01/24 1208)  albuterol  (VENTOLIN  HFA) 108 (90 Base) MCG/ACT inhaler 2 puff (2 puffs Inhalation Given 02/01/24 1248)  predniSONE  (DELTASONE ) tablet 40 mg (40 mg Oral Given 02/01/24 1247)    Initial Impression / Assessment and Plan / UC Course  I have reviewed the triage vital signs and the nursing notes.  Pertinent labs & imaging results that were available during my care of the patient were reviewed by me and considered in my medical decision making (see chart for details).  Vitals and triage, patient is hemodynamically stable.  Congestion and rhinorrhea present.  Tylenol  given in clinic for temperature of 100.1.  Wheezing and rhonchi present in all lobes.  Increased work of breathing noted.  Unable to speak in full sentences.  Audible wheezing.  History of asthma, will give DuoNeb.  Symptoms were not  improved after first DuoNeb, gave second treatment with albuterol  and patient started to feel better with better air movement on auscultation.  Chest x-ray obtained due to 5 days of fever and respiratory symptoms.  Chest x-ray by my interpretation does not show acute cardiopulmonary disease or pneumonia.  Confirmed with radiology overread.  POC COVID and flu testing negative.  Could be viral etiology, patient is on day 6 of symptoms and worsening.  Will cover for bacterial etiology with azithromycin .  Provided with albuterol  inhaler in clinic and given first dose of prednisone  for asthma exacerbation.  Plan of care, follow-up care and return precautions given, no questions at this time.  Work note provided.     Final Clinical Impressions(s) / UC Diagnoses   Final diagnoses:  Intermittent asthma with acute exacerbation, unspecified asthma severity     Discharge Instructions      Use the albuterol  inhaler every 4-6 hours as needed for wheezing and shortness of breath.  Take the antibiotics as prescribed with food to help prevent GI upset.  Please take cough medicine every 8 hours as needed.  We have given you the first dose of steroids today in clinic, start the next dose tomorrow with breakfast and take them for the next 4 days.  Do not hesitate to seek immediate care if you develop worsening wheezing or shortness of breath or inability to breathe.  Return to clinic for new or urgent symptoms.      ED Prescriptions     Medication Sig Dispense Auth. Provider   azithromycin  (ZITHROMAX ) 250 MG tablet Take 1 tablet (250 mg total) by mouth daily. Take first 2 tablets together, then  1 every day until finished. 6 tablet Dreama, Tani Virgo  N, FNP   predniSONE  (DELTASONE ) 20 MG tablet Take 2 tablets (40 mg total) by mouth daily with breakfast for 4 days. 8 tablet Dreama, Adonias Demore  N, FNP   benzonatate  (TESSALON ) 100 MG capsule Take 1 capsule (100 mg total) by mouth every 8 (eight) hours. 21  capsule Dreama, Matther Labell  N, FNP      PDMP not reviewed this encounter.   Dreama Vallery SAILOR, FNP 02/01/24 1309

## 2024-02-01 NOTE — Discharge Instructions (Signed)
 Use the albuterol  inhaler every 4-6 hours as needed for wheezing and shortness of breath.  Take the antibiotics as prescribed with food to help prevent GI upset.  Please take cough medicine every 8 hours as needed.  We have given you the first dose of steroids today in clinic, start the next dose tomorrow with breakfast and take them for the next 4 days.  Do not hesitate to seek immediate care if you develop worsening wheezing or shortness of breath or inability to breathe.  Return to clinic for new or urgent symptoms.

## 2024-03-21 ENCOUNTER — Ambulatory Visit: Admission: EM | Admit: 2024-03-21 | Discharge: 2024-03-21 | Disposition: A | Discharge status: Home / Self Care

## 2024-03-21 ENCOUNTER — Other Ambulatory Visit: Payer: Self-pay

## 2024-03-21 DIAGNOSIS — L500 Allergic urticaria: Secondary | ICD-10-CM

## 2024-03-21 MED ORDER — FEXOFENADINE HCL 180 MG PO TABS: 180.0000 mg | ORAL_TABLET | Freq: Every day | ORAL | 1 refills | Status: DC

## 2024-03-21 MED ORDER — FEXOFENADINE HCL 180 MG PO TABS: 180.0000 mg | ORAL_TABLET | Freq: Every day | ORAL | 1 refills | Status: AC

## 2024-03-21 MED ORDER — PREDNISONE 20 MG PO TABS: 40.0000 mg | ORAL_TABLET | Freq: Every day | ORAL | 0 refills | Status: AC

## 2024-03-21 MED ORDER — PREDNISONE 20 MG PO TABS: 40.0000 mg | ORAL_TABLET | Freq: Every day | ORAL | 0 refills | Status: DC

## 2024-03-21 NOTE — ED Triage Notes (Signed)
 Pt presents with a chief complaint of hives on right side of neck. States she took a nap. Laid down around 11 AM and woke up about two hours ago. Noticed symptoms then. Neck feels swollen and very stiff. Currently rates overall pain a 4/10. No medications taken PTA for symptoms reported. Denies SOB/throat pain. Unsure of what triggered this.

## 2024-03-21 NOTE — Discharge Instructions (Signed)
  1. Allergic urticaria (Primary) - fexofenadine (ALLEGRA) 180 MG tablet; Take 1 tablet (180 mg total) by mouth daily.  Dispense: 60 tablet; Refill: 1 - predniSONE  (DELTASONE ) 20 MG tablet; Take 2 tablets (40 mg total) by mouth daily for 5 days.  Dispense: 10 tablet; Refill: 0 -Continue to monitor symptoms for any change in severity if there is any escalation of current symptoms or development of new symptoms follow-up in ER for further evaluation and management.

## 2024-03-21 NOTE — ED Notes (Signed)
 Pt would like pharmacy changed to [facility] nearby. Hosey NP made aware.

## 2024-03-21 NOTE — ED Provider Notes (Signed)
 [facility]  Note:  This document was prepared using Dragon voice recognition software and may include unintentional dictation errors.  MRN: [id number] DOB: Dec 19, 1970  Subjective:   Lynn Pennington is a 53 y.o. female presenting for right sided neck skin irritation/hives since this morning around 11 AM.  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  Patient not sure if she got bit by an insect or spider.  Patient denies taking any over-the-counter medication prior to arrival in urgent care.  No shortness of breath, swelling to lips or tongue, stridor, wheezing, difficulty breathing.  Patient does not know specifically what may have triggered the symptoms.  No current facility-administered medications for this encounter.  Current Outpatient Medications:    albuterol  (VENTOLIN  HFA) 108 (90 Base) MCG/ACT inhaler, Inhale 1-2 puffs into the lungs every 6 (six) hours as needed for wheezing or shortness of breath., Disp: 1 each, Rfl: 1   azithromycin  (ZITHROMAX ) 250 MG tablet, Take 1 tablet (250 mg total) by mouth daily. Take first 2 tablets together, then 1 every day until finished., Disp: 6 tablet, Rfl: 0   benzonatate  (TESSALON ) 100 MG capsule, Take 1 capsule (100 mg total) by mouth every 8 (eight) hours., Disp: 21 capsule, Rfl: 0   EPINEPHrine  0.3 mg/0.3 mL IJ SOAJ injection, Inject 0.3 mg into the muscle as needed for anaphylaxis. (Patient not taking: Reported on 07/26/2023), Disp: 1 each, Rfl: 1   fexofenadine (ALLEGRA) 180 MG tablet, Take 1 tablet (180 mg total) by mouth daily., Disp: 60 tablet, Rfl: 1   predniSONE  (DELTASONE ) 20 MG tablet, Take 2 tablets (40 mg total) by mouth daily for 5 days., Disp: 10 tablet, Rfl: 0   rivaroxaban  (XARELTO ) 20 MG TABS tablet, Take 1 tablet (20 mg total) by mouth daily with supper., Disp: 90  tablet, Rfl: 1   Semaglutide -Weight Management (WEGOVY ) 0.5 MG/0.5ML SOAJ, Inject 0.5 mg into the skin once a week., Disp: 2 mL, Rfl: 1   Allergies  Allergen Reactions   Bee Venom Anaphylaxis   Penicillins Hives    Past Medical History:  Diagnosis Date   Anxiety    Asthma    Chronic leg pain    Depression    DVT (deep venous thrombosis) (HCC) 2010, 2016   History of femur fracture    left   Hypertension    Iron deficiency    Major depressive disorder with current active episode 11/13/2019   Patient reported history of depression that was treated with Sertraline .      Past Surgical History:  Procedure Laterality Date   ABDOMINAL HYSTERECTOMY     FRACTURE SURGERY Left    femur    Family History  Problem Relation Age of Onset   Hypertension Mother    Hypertension Sister    Cancer Maternal Grandmother        breast   Diabetes Maternal Aunt    Diabetes Maternal Uncle    Colon cancer Neg Hx    Esophageal cancer Neg Hx    Rectal cancer Neg Hx    Stomach cancer Neg Hx     Social History   Tobacco Use   Smoking status: Former    Current packs/day: 0.00    Types: Cigarettes    Quit date: 07/28/2020    Years since quitting: 3.6  Smokeless tobacco: Never   Tobacco comments:    stopped 2 months ago   Vaping Use   Vaping status: Every Day   Substances: Nicotine  Substance Use Topics   Alcohol use: Not Currently   Drug use: Not Currently    ROS Refer to HPI for ROS details.  Objective:   Vitals: BP (!) 145/87 (BP Location: Right Arm)   Pulse 76   Temp 98.6 F (37 C) (Oral)   Resp 18   Ht 5' 4 (1.626 m)   Wt 260 lb (117.9 kg)   SpO2 98%   BMI 44.63 kg/m   Physical Exam Vitals and nursing note reviewed.  Constitutional:      General: She is not in acute distress.    Appearance: Normal appearance. She is not ill-appearing.  HENT:     Head: Normocephalic.  Cardiovascular:     Rate and Rhythm: Normal rate.  Pulmonary:     Effort: Pulmonary  effort is normal. No respiratory distress.  Skin:    General: Skin is warm and dry.     Capillary Refill: Capillary refill takes less than 2 seconds.     Findings: Erythema and rash present. Rash is macular and urticarial.  Neurological:     General: No focal deficit present.     Mental Status: She is alert and oriented to person, place, and time.  Psychiatric:        Mood and Affect: Mood normal.        Behavior: Behavior normal.     Procedures  No results found for this or any previous visit (from the past 24 hours).  No results found.   Assessment and Plan :     Discharge Instructions       1. Allergic urticaria (Primary) - fexofenadine (ALLEGRA) 180 MG tablet; Take 1 tablet (180 mg total) by mouth daily.  Dispense: 60 tablet; Refill: 1 - predniSONE  (DELTASONE ) 20 MG tablet; Take 2 tablets (40 mg total) by mouth daily for 5 days.  Dispense: 10 tablet; Refill: 0 -Continue to monitor symptoms for any change in severity if there is any escalation of current symptoms or development of new symptoms follow-up in ER for further evaluation and management.      Taiyo Kozma B Elanah Osmanovic   Mariadejesus Cade B, [place] 03/21/24 2018
